# Patient Record
Sex: Female | Born: 1980 | Hispanic: Yes | Marital: Married | Smoking: Never smoker
Health system: Southern US, Community
[De-identification: ages and names within clinical notes are randomized; demographics above are authoritative.]

## PROBLEM LIST (undated history)

## (undated) DIAGNOSIS — E119 Type 2 diabetes mellitus without complications: Secondary | ICD-10-CM

## (undated) DIAGNOSIS — N939 Abnormal uterine and vaginal bleeding, unspecified: Secondary | ICD-10-CM

## (undated) DIAGNOSIS — K759 Inflammatory liver disease, unspecified: Secondary | ICD-10-CM

## (undated) DIAGNOSIS — E785 Hyperlipidemia, unspecified: Secondary | ICD-10-CM

## (undated) DIAGNOSIS — D649 Anemia, unspecified: Secondary | ICD-10-CM

## (undated) HISTORY — DX: Abnormal uterine and vaginal bleeding, unspecified: N93.9

## (undated) HISTORY — PX: CHOLECYSTECTOMY: SHX55

---

## 2012-09-21 ENCOUNTER — Emergency Department: Payer: Self-pay | Admitting: Emergency Medicine

## 2012-09-21 LAB — URINALYSIS, COMPLETE
Bilirubin,UR: NEGATIVE
Blood: NEGATIVE
Glucose,UR: >=500 mg/dL (ref 0–75)
Leukocyte Esterase: NEGATIVE
Ph: 5 (ref 4.5–8.0)
Protein: NEGATIVE
Specific Gravity: 1.036 (ref 1.003–1.030)
Squamous Epithelial: 1
WBC UR: 1 /HPF (ref 0–5)

## 2012-09-21 LAB — CBC
HCT: 42.4 % (ref 35.0–47.0)
HGB: 14.5 g/dL (ref 12.0–16.0)
MCH: 27.5 pg (ref 26.0–34.0)
MCHC: 34.2 g/dL (ref 32.0–36.0)
MCV: 80 fL (ref 80–100)
Platelet: 366 10*3/uL (ref 150–440)
RBC: 5.28 10*6/uL — ABNORMAL HIGH (ref 3.80–5.20)
RDW: 13.1 % (ref 11.5–14.5)

## 2012-09-21 LAB — COMPREHENSIVE METABOLIC PANEL
Alkaline Phosphatase: 119 U/L (ref 50–136)
Anion Gap: 9 (ref 7–16)
Calcium, Total: 9.5 mg/dL (ref 8.5–10.1)
Chloride: 98 mmol/L (ref 98–107)
EGFR (African American): >60
Osmolality: 274 (ref 275–301)
Potassium: 4.2 mmol/L (ref 3.5–5.1)
SGOT(AST): 23 U/L (ref 15–37)
Sodium: 130 mmol/L — ABNORMAL LOW (ref 136–145)
Total Protein: 8.8 g/dL — ABNORMAL HIGH (ref 6.4–8.2)

## 2012-09-23 ENCOUNTER — Emergency Department: Payer: Self-pay | Admitting: Emergency Medicine

## 2012-09-23 LAB — CBC
HGB: 13.3 g/dL (ref 12.0–16.0)
Platelet: 334 10*3/uL (ref 150–440)
RBC: 4.79 10*6/uL (ref 3.80–5.20)
WBC: 7.3 10*3/uL (ref 3.6–11.0)

## 2012-09-24 LAB — COMPREHENSIVE METABOLIC PANEL
Albumin: 3.8 g/dL (ref 3.4–5.0)
Alkaline Phosphatase: 120 U/L (ref 50–136)
BUN: 20 mg/dL — ABNORMAL HIGH (ref 7–18)
Calcium, Total: 9.4 mg/dL (ref 8.5–10.1)
Chloride: 99 mmol/L (ref 98–107)
Creatinine: 0.7 mg/dL (ref 0.60–1.30)
EGFR (Non-African Amer.): >60
Glucose: 425 mg/dL — ABNORMAL HIGH (ref 65–99)
Osmolality: 282 (ref 275–301)
Potassium: 4.1 mmol/L (ref 3.5–5.1)
SGOT(AST): 20 U/L (ref 15–37)
SGPT (ALT): 29 U/L (ref 12–78)
Sodium: 130 mmol/L — ABNORMAL LOW (ref 136–145)
Total Protein: 7.8 g/dL (ref 6.4–8.2)

## 2012-09-24 LAB — URINALYSIS, COMPLETE
Bacteria: NONE SEEN
Blood: NEGATIVE
Glucose,UR: >=500 mg/dL (ref 0–75)
Leukocyte Esterase: NEGATIVE
Nitrite: NEGATIVE
Ph: 5 (ref 4.5–8.0)
Protein: NEGATIVE
RBC,UR: 1 /HPF (ref 0–5)
Squamous Epithelial: <1

## 2012-09-26 ENCOUNTER — Emergency Department: Payer: Self-pay | Admitting: Emergency Medicine

## 2012-09-26 LAB — COMPREHENSIVE METABOLIC PANEL
Bilirubin,Total: 0.4 mg/dL (ref 0.2–1.0)
Calcium, Total: 9.5 mg/dL (ref 8.5–10.1)
Chloride: 98 mmol/L (ref 98–107)
Co2: 22 mmol/L (ref 21–32)
EGFR (African American): >60
Glucose: 289 mg/dL — ABNORMAL HIGH (ref 65–99)
SGOT(AST): 25 U/L (ref 15–37)
Sodium: 132 mmol/L — ABNORMAL LOW (ref 136–145)
Total Protein: 7.7 g/dL (ref 6.4–8.2)

## 2012-09-26 LAB — URINALYSIS, COMPLETE
Bacteria: NONE SEEN
Bilirubin,UR: NEGATIVE
Glucose,UR: >=500 mg/dL (ref 0–75)
Leukocyte Esterase: NEGATIVE
Nitrite: NEGATIVE
Ph: 5 (ref 4.5–8.0)
Protein: NEGATIVE
RBC,UR: NONE SEEN /HPF (ref 0–5)
Squamous Epithelial: 1
WBC UR: 1 /HPF (ref 0–5)

## 2012-09-26 LAB — CBC
HCT: 38.1 % (ref 35.0–47.0)
HGB: 13.4 g/dL (ref 12.0–16.0)
MCH: 28 pg (ref 26.0–34.0)
Platelet: 344 10*3/uL (ref 150–440)
WBC: 6.3 10*3/uL (ref 3.6–11.0)

## 2012-10-06 ENCOUNTER — Other Ambulatory Visit: Payer: Self-pay | Admitting: Family Medicine

## 2012-10-06 LAB — BASIC METABOLIC PANEL
BUN: 10 mg/dL (ref 7–18)
Chloride: 104 mmol/L (ref 98–107)
Creatinine: 0.82 mg/dL (ref 0.60–1.30)
EGFR (African American): >60
Glucose: 80 mg/dL (ref 65–99)
Potassium: 3.6 mmol/L (ref 3.5–5.1)
Sodium: 140 mmol/L (ref 136–145)

## 2012-10-06 LAB — LIPID PANEL
Cholesterol: 181 mg/dL (ref 0–200)
HDL Cholesterol: 61 mg/dL — ABNORMAL HIGH (ref 40–60)
Ldl Cholesterol, Calc: 97 mg/dL (ref 0–100)
Triglycerides: 117 mg/dL (ref 0–200)

## 2012-10-16 ENCOUNTER — Ambulatory Visit: Payer: Self-pay | Admitting: Family Medicine

## 2012-10-18 ENCOUNTER — Ambulatory Visit: Payer: Self-pay | Admitting: Family Medicine

## 2012-12-06 ENCOUNTER — Ambulatory Visit: Payer: Self-pay | Admitting: Internal Medicine

## 2013-02-27 ENCOUNTER — Inpatient Hospital Stay: Payer: Self-pay | Admitting: Surgery

## 2013-02-27 LAB — CBC
HCT: 38 % (ref 35.0–47.0)
HGB: 12.8 g/dL (ref 12.0–16.0)
MCH: 26.8 pg (ref 26.0–34.0)
MCHC: 33.6 g/dL (ref 32.0–36.0)
MCV: 80 fL (ref 80–100)
Platelet: 362 10*3/uL (ref 150–440)
RBC: 4.75 10*6/uL (ref 3.80–5.20)
RDW: 15 % — ABNORMAL HIGH (ref 11.5–14.5)
WBC: 6.2 10*3/uL (ref 3.6–11.0)

## 2013-02-27 LAB — COMPREHENSIVE METABOLIC PANEL
ALBUMIN: 4.4 g/dL (ref 3.4–5.0)
Alkaline Phosphatase: 89 U/L
Anion Gap: 6 — ABNORMAL LOW (ref 7–16)
BUN: 14 mg/dL (ref 7–18)
Bilirubin,Total: 0.4 mg/dL (ref 0.2–1.0)
Calcium, Total: 9.8 mg/dL (ref 8.5–10.1)
Chloride: 102 mmol/L (ref 98–107)
Co2: 28 mmol/L (ref 21–32)
Creatinine: 0.63 mg/dL (ref 0.60–1.30)
EGFR (African American): >60
Glucose: 213 mg/dL — ABNORMAL HIGH (ref 65–99)
Osmolality: 279 (ref 275–301)
POTASSIUM: 3.1 mmol/L — AB (ref 3.5–5.1)
SGOT(AST): 14 U/L — ABNORMAL LOW (ref 15–37)
SGPT (ALT): 23 U/L (ref 12–78)
Sodium: 136 mmol/L (ref 136–145)
TOTAL PROTEIN: 8.8 g/dL — AB (ref 6.4–8.2)

## 2013-02-27 LAB — URINALYSIS, COMPLETE
Bacteria: NONE SEEN
Bilirubin,UR: NEGATIVE
Blood: NEGATIVE
Glucose,UR: >=500 mg/dL (ref 0–75)
Leukocyte Esterase: NEGATIVE
Nitrite: NEGATIVE
PH: 8 (ref 4.5–8.0)
PROTEIN: NEGATIVE
RBC,UR: 1 /HPF (ref 0–5)
Specific Gravity: 1.017 (ref 1.003–1.030)
Squamous Epithelial: 2
WBC UR: NONE SEEN /HPF (ref 0–5)

## 2013-02-27 LAB — POTASSIUM: POTASSIUM: 4.5 mmol/L (ref 3.5–5.1)

## 2013-02-27 LAB — LIPASE, BLOOD: LIPASE: 199 U/L (ref 73–393)

## 2013-03-01 LAB — PATHOLOGY REPORT

## 2013-03-02 LAB — CBC WITH DIFFERENTIAL/PLATELET
BASOS ABS: 0.1 10*3/uL (ref 0.0–0.1)
BASOS PCT: 1 %
EOS PCT: 3.6 %
Eosinophil #: 0.2 10*3/uL (ref 0.0–0.7)
HCT: 28.7 % — ABNORMAL LOW (ref 35.0–47.0)
HGB: 9.6 g/dL — ABNORMAL LOW (ref 12.0–16.0)
LYMPHS PCT: 54.2 %
Lymphocyte #: 2.8 10*3/uL (ref 1.0–3.6)
MCH: 27 pg (ref 26.0–34.0)
MCHC: 33.6 g/dL (ref 32.0–36.0)
MCV: 80 fL (ref 80–100)
MONOS PCT: 6.5 %
Monocyte #: 0.3 x10 3/mm (ref 0.2–0.9)
Neutrophil #: 1.8 10*3/uL (ref 1.4–6.5)
Neutrophil %: 34.7 %
PLATELETS: 257 10*3/uL (ref 150–440)
RBC: 3.58 10*6/uL — ABNORMAL LOW (ref 3.80–5.20)
RDW: 14.6 % — AB (ref 11.5–14.5)
WBC: 5.1 10*3/uL (ref 3.6–11.0)

## 2013-03-02 LAB — COMPREHENSIVE METABOLIC PANEL
AST: 29 U/L (ref 15–37)
Albumin: 2.5 g/dL — ABNORMAL LOW (ref 3.4–5.0)
Alkaline Phosphatase: 44 U/L — ABNORMAL LOW
Anion Gap: 4 — ABNORMAL LOW (ref 7–16)
BUN: 11 mg/dL (ref 7–18)
Bilirubin,Total: 0.4 mg/dL (ref 0.2–1.0)
CALCIUM: 8.4 mg/dL — AB (ref 8.5–10.1)
CO2: 26 mmol/L (ref 21–32)
Chloride: 108 mmol/L — ABNORMAL HIGH (ref 98–107)
Creatinine: 0.56 mg/dL — ABNORMAL LOW (ref 0.60–1.30)
EGFR (Non-African Amer.): >60
Glucose: 95 mg/dL (ref 65–99)
Osmolality: 275 (ref 275–301)
Potassium: 3.6 mmol/L (ref 3.5–5.1)
SGPT (ALT): 27 U/L (ref 12–78)
SODIUM: 138 mmol/L (ref 136–145)
Total Protein: 5.6 g/dL — ABNORMAL LOW (ref 6.4–8.2)

## 2014-05-11 NOTE — Op Note (Signed)
PATIENT NAME:  Caitlyn Vincent, Caitlyn Vincent MR#:  [id number] DATE OF BIRTH:  03-29-1980  DATE OF PROCEDURE:  02/27/2013  PREOPERATIVE DIAGNOSIS: Acute cholecystitis.   POSTOPERATIVE DIAGNOSIS: Acute cholecystitis.   PROCEDURE: Laparoscopic cholecystectomy.   SURGEON: Phoebe Perch, M.D.   ASSISTANTSalome Arnt, PA-S    INDICATIONS: This is a patient with epigastrium and right upper quadrant pain, workup showing acute cholecystitis. Preoperatively via an interpreter we discussed the rationale for surgery, the options of observation, risks of bleeding, infection, recurrence of symptoms, failure to resolve her symptoms, open procedure, bile duct damage, bile duct leak, retained common bile duct stone, any of which could require further surgery and/or ERCP, stent and papillotomy. This was all reviewed for she and her husband via the interpreter, They understood and agreed to proceed.   FINDINGS: Acute cholecystitis, edematous gallbladder.   DESCRIPTION OF PROCEDURE: The patient was induced to general anesthesia. She was given IV antibiotics, VTE prophylaxis was in place. She was prepped and draped in a sterile fashion. Marcaine was infiltrated in the skin and subcutaneous tissues around the periumbilical area. Incision was made. Veress needle was placed. Pneumoperitoneum was obtained. A 5 mm trocar port was placed. The abdominal cavity was explored and under direct vision a 10 mm epigastric port and 2 lateral 5 mm ports were placed. The liver was noted to have multiple adhesions from the dome of the liver to the diaphragm. Some of these were taken down sharply without the use of energy and then the gallbladder was placed on tension. The peritoneum over the infundibulum was incised bluntly. The cystic duct/gallbladder junction was well identified. The cystic artery was doubly clipped and divided, allowing for good visualization of the cystic duct as it entered the infundibulum. It was doubly clipped and divided and  the gallbladder was taken from the gallbladder fossa with electrocautery and passed out through the epigastric port site. The port site was placed.  The area was irrigated with copious amounts of normal saline. Hemostasis was adequate. There was no sign of bleeding, bile leak or bowel injury. The area was irrigated and aspirated and then the camera was placed in the epigastric site to view back to the periumbilical site. There was no sign of adhesions other than over the liver. Therefore, pneumoperitoneum was released. All ports were removed. Fascial edges at the epigastric site were approximated with 0 Vicryl; 4-0 subcuticular Monocryl was used on all skin edges. Steri-Strips, Mastisol and sterile dressings were placed. The patient tolerated the procedure well. There were no complications. She was taken to the recovery room in stable condition to be admitted for continued care.    ____________________________ Jerrol Banana. Burt Knack, MD rec:cs D: 02/27/2013 15:30:59 ET T: 02/27/2013 19:10:03 ET JOB#: [id number]  cc: Jerrol Banana. Burt Knack, MD, <Dictator> Florene Glen MD ELECTRONICALLY SIGNED 02/28/2013 16:48

## 2014-05-11 NOTE — Consult Note (Signed)
PATIENT NAME:  Caitlyn Vincent, FRID MR#:  [id number] DATE OF BIRTH:  1980/07/12  MEDICAL CONSULTATION   DATE OF CONSULTATION:  02/27/2013  REFERRING PHYSICIAN:  Micheline Maze, MD CONSULTING PHYSICIAN:  Nicholes Mango, MD  REASON FOR MEDICAL CONSULTATION: Medical management of diabetes.   HISTORY OF PRESENT ILLNESS: The patient is a 34 year old Hispanic female with past medical history of insulin-dependent diabetes mellitus, who came into the ER with 8-hour history of epigastric abdominal pain associated with nausea and vomiting. Abdominal ultrasound has revealed cholelithiasis without cholecystitis. The patient is diagnosed with biliary colic. Dr. Pat Patrick is consulted. He is planning to do surgery today, and hospitalist team is consulted for diabetes management. During my examination, the patient is still complaining of epigastric abdominal pain without radiation. She has intractable nausea and vomiting. Denies any chest pain or shortness of breath. No family members at bedside. Denies any other complaints.   PAST MEDICAL HISTORY: Insulin-dependent diabetes mellitus.   PAST SURGICAL HISTORY: None.   ALLERGIES: No known drug allergies.   HOME MEDICATIONS:  1. Lantus dose unknown.  2. Metformin 500 mg 2 tablets p.o. 2 times a day.   PSYCHOSOCIAL HISTORY: Lives at home with family. No history of smoking, alcohol or illicit drug usage   REVIEW OF SYSTEMS:  CONSTITUTIONAL: Denies any fever, fatigue.  EYES: Denies blurry vision or double vision.  ENT: Denies epistaxis or discharge.  RESPIRATION: Denies cough, COPD.  CARDIOVASCULAR: No chest pain, palpitations.  GASTROINTESTINAL: Complaining of intractable nausea and vomiting, episodic epigastric abdominal pain.  ENDOCRINE: Denies polyuria, nocturia, thyroid problems. Has insulin-dependent diabetes mellitus. GYNECOLOGIC AND BREASTS: Denies breast mass or vaginal discharge.  INTEGUMENTARY: No acne, rash, lesions.  MUSCULOSKELETAL: No joint pain in  the back, neck. Denies gout. NEUROLOGIC: No vertigo or ataxia.  PSYCHIATRIC: No ADD or OCD.  PHYSICAL EXAMINATION:  VITAL SIGNS: Temperature 97.8, pulse 63, respirations 18, blood pressure is 103/58, pulse oximetry 100%.  GENERAL APPEARANCE: Not under acute distress. Moderately built and nourished.  HEENT: Normocephalic, atraumatic. Pupils are equally reacting to light and accommodation. No scleral icterus. No conjunctival injection. No sinus tenderness. No postnasal drip. Dry mucous membranes.  NECK: Supple. No JVD. No thyromegaly. Trachea is midline. Range of motion is intact.  LUNGS: Clear to auscultation bilaterally. No accessory muscle usage.  CARDIAC: S1, S2 normal. Regular rate and rhythm. No murmurs.  GASTROINTESTINAL: Soft. Bowel sounds are positive in all 4 quadrants. Positive epigastric tenderness. No rebound tenderness. No masses felt.  NEUROLOGIC: Awake, alert, oriented x3. Motor and sensory grossly intact. Reflexes are 2+.  EXTREMITIES: No edema. No cyanosis. No clubbing.  SKIN: Warm to touch. Normal turgor. No rashes. No lesions.  MUSCULOSKELETAL: No joint effusion, tenderness, erythema.  PSYCHIATRIC: Normal mood and affect.   LABORATORY AND IMAGING STUDIES: Ultrasound of the abdomen has revealed cholelithiasis without cholecystitis, hepatic steatosis. Urine pregnancy test is negative. LFTs: Total protein is elevated at 8.8, AST is low at 14. The rest of the LFTs are normal. CBC normal. Urinalysis: Trace ketones, cloudy in appearance, leukocytes and nitrites are negative. CHEM-8: Glucose is 213. BUN and creatinine are normal. Sodium 136, potassium 3.1, anion gap 6. The rest of the BMP results are normal. Lipase is at 199.   ASSESSMENT AND PLAN: A 34 year old Hispanic female who came into the ER with epigastric abdominal pain, nausea, vomiting and diagnosed with biliary colic, admitted to Dr. Rolin Barry service. Hospitalist team is consulted regarding medical management of diabetes  mellitus.   1. Acute epigastric  pain secondary to biliary colic. No acute cholecystitis. Management per surgery. The patient is n.p.o. and getting IV fluids with potassium.  2. Intractable nausea and vomiting, probably from biliary colic and side effects of narcotic medications. The patient is n.p.o. Will give her Zofran 4 mg IV q.6 hours p.r.n. Continue IV fluids with potassium supplements.  3. Hypokalemia. Supplement potassium via IV fluids.  4. Insulin-dependent diabetes mellitus. The patient is currently n.p.o. She will be on sliding scale insulin while she is n.p.o. Postoperatively, will follow and manage diabetes.  5. Will provide her gastrointestinal prophylaxis with Protonix IV.  6. Deep venous thrombosis prophylaxis with Lovenox.   CODE STATUS: She is full code.  Thank you, Dr. Pat Patrick, for consulting hospitalist team regarding medical management.   TIME SPENT: 45 minutes.   ____________________________ Nicholes Mango, MD ag:lb D: 02/27/2013 07:43:49 ET T: 02/27/2013 08:06:09 ET JOB#: [id number]  cc: Nicholes Mango, MD, <Dictator> Nicholes Mango MD ELECTRONICALLY SIGNED 03/15/2013 18:36

## 2014-05-11 NOTE — H&P (Signed)
PATIENT NAME:  Caitlyn Vincent, Caitlyn Vincent MR#:  [id number] DATE OF BIRTH:  08-May-1980  DATE OF ADMISSION:  02/27/2013  PRIMARY CARE PHYSICIAN: Dr. Ellene Route.   ADMITTING PHYSICIAN: Dr. Pat Patrick.   CHIEF COMPLAINT: Abdominal pain, nausea and vomiting.   BRIEF HISTORY: Caitlyn Vincent is a 34 year old woman seen in the Emergency Room for  an approximate 8 hour history of substernal abdominal pain, midepigastric abdominal pain associated with 2 episodes of nausea and vomiting. She  had 3 previous episodes, the most recent one in September 2014. She is evaluated in the Emergency Room and felt to have evidence for biliary tract disease without acute cholecystitis and was referred to our office. Apparently, there was miscommunication. The patient did not follow up with [place]. It is unclear the reason behind her failure to follow up. She has no other significant GI history. She has history of hepatitis, yellow jaundice, pancreatitis, peptic ulcer disease or diverticulitis. Ultrasound work-up today revealed stones without evidence of acute cholecystitis. Liver function studies were unremarkable. She does not have an elevated white blood cell count. However, she continues to have significant substernal pain and will be admitted for that reason.   Her medical history is significant for diabetes, occasionally insulin-dependent. She is followed by a primary care group in the community and is on p.o. medications but occasionally takes sliding scale insulin. She denies any previous surgical history. She does not have a history of cardiac disease, hypertension or thyroid disease.   CURRENT MEDICATIONS: Include metformin 1000 mg b.i.d. with meals.   ALLERGIES:  She has no medical allergies.   SOCIAL HISTORY: She is not a cigarette smoker. She does not drink alcohol. She takes no illicit drugs.    REVIEW OF SYSTEMS: Otherwise unremarkable.   PHYSICAL EXAMINATION: GENERAL: She is an alert woman in moderate distress from abdominal  discomfort. Her pain was initially controlled with Dilaudid but has continued.  VITAL SIGNS: Blood pressure is 117/60, heart rate 82 and regular.  HEENT: Unremarkable. No scleral icterus. There is no pupillary abnormalities.  NECK: Supple, nontender with a midline trachea. No adenopathy.  CHEST: Clear. No adventitious sounds and normal pulmonary excursion.  CARDIAC: No murmurs or gallops. She seems to be in normal sinus rhythm. She does appear to have colicky pain on presentation. Her pain is substernal, midepigastric without radiation. She has no significant abdominal findings other than some mild substernal tenderness. She has minimal guarding. No rebound. She has active bowel sounds.  EXTREMITIES: Lower extremity exam reveals full range of motion, no deformities. Good distal pulses.  PSYCHIATRIC: Normal orientation, normal affect. She is accompanied by her husband.   ASSESSMENT AND PLAN: I discussed the situation with the husband and the patient through a hospital interpreter. They are quite upset at the lack of follow-up or intervention in the past. She does appear to have symptomatic biliary tract disease at the present time. We will admit her for pain control and plan for surgical intervention as can be arranged in the operating room. The patient and family are in agreement.   ____________________________ Micheline Maze, MD rle:dp D: 02/27/2013 06:51:55 ET T: 02/27/2013 07:14:26 ET JOB#: [id number]  cc: Rodena Goldmann III, MD, <Dictator> Raye Sorrow, MD Rodena Goldmann MD ELECTRONICALLY SIGNED 02/28/2013 1:09

## 2014-05-11 NOTE — Discharge Summary (Signed)
PATIENT NAME:  Caitlyn Vincent, Caitlyn Vincent MR#:  [id number] DATE OF BIRTH:  11-23-1980  DATE OF ADMISSION:  02/27/2013 DATE OF DISCHARGE:  03/02/2013   DIAGNOSES:  1. Acute cholecystitis.  2. Cholelithiasis.  3. Diabetes.   PROCEDURE: Laparoscopic cholecystectomy.   HISTORY OF PRESENT ILLNESS AND HOSPITAL COURSE: This is a patient with unrelenting right upper quadrant pain. She has had multiple episodes of biliary colic in the past, but this time the pain has not gone away, and a workup has shown gallstones with likely acute cholecystitis. She was taken to the operating room, where laparoscopic cholecystectomy was performed. Postoperatively, she did well. She was continued on IV antibiotics for a short period of time and is currently tolerating a regular diet and is discharged in stable condition with oral analgesics, to restart all of her home medications and instructions to shower. She will follow up in our office in 10 days.    ____________________________ Jerrol Banana Burt Knack, MD rec:lb D: 03/02/2013 12:59:23 ET T: 03/02/2013 13:27:25 ET JOB#: [id number]  cc: Jerrol Banana. Burt Knack, MD, <Dictator> Florene Glen MD ELECTRONICALLY SIGNED 03/02/2013 16:23

## 2014-08-27 ENCOUNTER — Emergency Department
Admission: EM | Admit: 2014-08-27 | Discharge: 2014-08-27 | Disposition: A | Discharge status: Home / Self Care | Payer: Self-pay | Attending: Emergency Medicine | Admitting: Emergency Medicine

## 2014-08-27 ENCOUNTER — Encounter: Payer: Self-pay | Admitting: Emergency Medicine

## 2014-08-27 DIAGNOSIS — Z794 Long term (current) use of insulin: Secondary | ICD-10-CM | POA: Insufficient documentation

## 2014-08-27 DIAGNOSIS — Z3202 Encounter for pregnancy test, result negative: Secondary | ICD-10-CM | POA: Insufficient documentation

## 2014-08-27 DIAGNOSIS — E1165 Type 2 diabetes mellitus with hyperglycemia: Secondary | ICD-10-CM | POA: Insufficient documentation

## 2014-08-27 DIAGNOSIS — R739 Hyperglycemia, unspecified: Secondary | ICD-10-CM

## 2014-08-27 HISTORY — DX: Type 2 diabetes mellitus without complications: E11.9

## 2014-08-27 LAB — BLOOD GAS, ARTERIAL
ALLENS TEST (PASS/FAIL): POSITIVE — AB
Acid-base deficit: 1.5 mmol/L (ref 0.0–2.0)
BICARBONATE: 21.3 meq/L (ref 21.0–28.0)
FIO2: 0.21
O2 Saturation: 98.5 %
PCO2 ART: 30 mmHg — AB (ref 32.0–48.0)
Patient temperature: 37
pH, Arterial: 7.46 — ABNORMAL HIGH (ref 7.350–7.450)
pO2, Arterial: 110 mmHg — ABNORMAL HIGH (ref 83.0–108.0)

## 2014-08-27 LAB — CBC
HEMATOCRIT: 31.1 % — AB (ref 35.0–47.0)
Hemoglobin: 9.8 g/dL — ABNORMAL LOW (ref 12.0–16.0)
MCH: 22.3 pg — ABNORMAL LOW (ref 26.0–34.0)
MCHC: 31.5 g/dL — AB (ref 32.0–36.0)
MCV: 70.9 fL — ABNORMAL LOW (ref 80.0–100.0)
Platelets: 366 10*3/uL (ref 150–440)
RBC: 4.39 MIL/uL (ref 3.80–5.20)
RDW: 16.5 % — ABNORMAL HIGH (ref 11.5–14.5)
WBC: 5.4 10*3/uL (ref 3.6–11.0)

## 2014-08-27 LAB — COMPREHENSIVE METABOLIC PANEL
ALBUMIN: 3.9 g/dL (ref 3.5–5.0)
ALT: 16 U/L (ref 14–54)
AST: 21 U/L (ref 15–41)
Alkaline Phosphatase: 59 U/L (ref 38–126)
Anion gap: 8 (ref 5–15)
BILIRUBIN TOTAL: 0.2 mg/dL — AB (ref 0.3–1.2)
BUN: 17 mg/dL (ref 6–20)
CO2: 25 mmol/L (ref 22–32)
CREATININE: 0.7 mg/dL (ref 0.44–1.00)
Calcium: 9.1 mg/dL (ref 8.9–10.3)
Chloride: 96 mmol/L — ABNORMAL LOW (ref 101–111)
GFR calc non Af Amer: >60 mL/min (ref >60)
GLUCOSE: 541 mg/dL — AB (ref 65–99)
Potassium: 4 mmol/L (ref 3.5–5.1)
SODIUM: 129 mmol/L — AB (ref 135–145)
Total Protein: 7.2 g/dL (ref 6.5–8.1)

## 2014-08-27 LAB — GLUCOSE, CAPILLARY
GLUCOSE-CAPILLARY: 206 mg/dL — AB (ref 65–99)
Glucose-Capillary: >600 mg/dL (ref 65–99)
Glucose-Capillary: 190 mg/dL — ABNORMAL HIGH (ref 65–99)

## 2014-08-27 LAB — URINALYSIS COMPLETE WITH MICROSCOPIC (ARMC ONLY)
Bilirubin Urine: NEGATIVE
Glucose, UA: >500 mg/dL — AB
Hgb urine dipstick: NEGATIVE
KETONES UR: NEGATIVE mg/dL
Leukocytes, UA: NEGATIVE
Nitrite: NEGATIVE
PH: 5 (ref 5.0–8.0)
PROTEIN: NEGATIVE mg/dL
Specific Gravity, Urine: 1.024 (ref 1.005–1.030)

## 2014-08-27 LAB — POCT PREGNANCY, URINE: Preg Test, Ur: NEGATIVE

## 2014-08-27 MED ORDER — INSULIN REGULAR HUMAN 100 UNIT/ML IJ SOLN: 20.0000 [IU] | Freq: Three times a day (TID) | INTRAMUSCULAR | Status: DC

## 2014-08-27 MED ORDER — SODIUM CHLORIDE 0.9 % IV BOLUS (SEPSIS)
2000.0000 mL | Freq: Once | INTRAVENOUS | Status: AC
  Administered 2014-08-27: 2000 mL via INTRAVENOUS

## 2014-08-27 MED ORDER — SODIUM CHLORIDE 0.9 % IV SOLN
INTRAVENOUS | Status: DC
  Filled 2014-08-27 (×2): qty 2.5

## 2014-08-27 MED ORDER — INSULIN ASPART 100 UNIT/ML ~~LOC~~ SOLN
SUBCUTANEOUS | Status: AC
  Administered 2014-08-27: 8 [IU]
  Filled 2014-08-27: qty 8

## 2014-08-27 NOTE — ED Notes (Signed)
CBG result 190; MD made aware.

## 2014-08-27 NOTE — ED Notes (Signed)
Pt given water to drink for urine spec.

## 2014-08-27 NOTE — ED Provider Notes (Signed)
[facility] Emergency Department Provider Note  ____________________________________________  Time seen: 5:15 AM  I have reviewed the triage vital signs and the nursing notes.   HISTORY  Chief Complaint Hyperglycemia; Dizziness; Weakness; and Urinary Frequency      HPI Caitlyn Vincent is a 34 y.o. female presents with hyperglycemia, polyuria, polydipsia. Patient stated glucose was noted to be high at home glucose read critical high on arrival to the emergency department. Patient denies any recent illness no cough no fever no abdominal pain vomiting or diarrhea. Patient states that "she's been under a lot of stress"     Past Medical History  Diagnosis Date  . Diabetes mellitus without complication     There are no active problems to display for this patient.   Past Surgical History  Procedure Laterality Date  . Cholecystectomy      Current Outpatient Rx  Name  Route  Sig  Dispense  Refill  . insulin regular (NOVOLIN R,HUMULIN R) 100 units/mL injection   Subcutaneous   Inject 20 Units into the skin 3 (three) times daily before meals.           Allergies Review of patient's allergies indicates no known allergies.  No family history on file.  Social History History  Substance Use Topics  . Smoking status: Never Smoker   . Smokeless tobacco: Not on file  . Alcohol Use: Yes    Review of Systems  Constitutional: Negative for fever. Eyes: Negative for visual changes. ENT: Negative for sore throat. Cardiovascular: Negative for chest pain. Respiratory: Negative for shortness of breath. Gastrointestinal: Negative for abdominal pain, vomiting and diarrhea. Genitourinary: Negative for dysuria. Polyuria polydipsia Musculoskeletal: Negative for back pain. Skin: Negative for rash. Neurological: Negative for headaches, focal weakness or numbness.   10-point ROS otherwise  negative.  ____________________________________________   PHYSICAL EXAM:  VITAL SIGNS: ED Triage Vitals  Enc Vitals Group     BP 08/27/14 0507 109/56 mmHg     Pulse Rate 08/27/14 0507 63     Resp 08/27/14 0507 18     Temp 08/27/14 0507 98.1 F (36.7 C)     Temp Source 08/27/14 0507 Oral     SpO2 08/27/14 0507 100 %     Weight 08/27/14 0507 125 lb (56.7 kg)     Height 08/27/14 0507 5' (1.524 m)     Head Cir --      Peak Flow --      Pain Score 08/27/14 0511 0     Pain Loc --      Pain Edu? --      Excl. in [place]? --      Constitutional: Alert and oriented. Well appearing and in no distress. Eyes: Conjunctivae are normal. PERRL. Normal extraocular movements. ENT   Head: Normocephalic and atraumatic.   Nose: No congestion/rhinnorhea.   Mouth/Throat: Mucous membranes are moist.   Neck: No stridor. Cardiovascular: Normal rate, regular rhythm. Normal and symmetric distal pulses are present in all extremities. No murmurs, rubs, or gallops. Respiratory: Normal respiratory effort without tachypnea nor retractions. Breath sounds are clear and equal bilaterally. No wheezes/rales/rhonchi. Gastrointestinal: Soft and nontender. No distention. There is no CVA tenderness. Genitourinary: deferred Musculoskeletal: Nontender with normal range of motion in all extremities. No joint effusions.  No lower extremity tenderness nor edema. Neurologic:  Normal speech and language. No gross focal neurologic deficits are appreciated. Speech is normal.  Skin:  Skin is warm, dry and intact. No rash noted. Psychiatric: Mood  and affect are normal. Speech and behavior are normal. Patient exhibits appropriate insight and judgment.  ____________________________________________    LABS (pertinent positives/negatives)  Labs Reviewed  GLUCOSE, CAPILLARY - Abnormal; Notable for the following:    Glucose-Capillary >600 (*)    All other components within normal limits  CBC - Abnormal; Notable for  the following:    Hemoglobin 9.8 (*)    HCT 31.1 (*)    MCV 70.9 (*)    MCH 22.3 (*)    MCHC 31.5 (*)    RDW 16.5 (*)    All other components within normal limits  COMPREHENSIVE METABOLIC PANEL - Abnormal; Notable for the following:    Sodium 129 (*)    Chloride 96 (*)    Glucose, Bld 541 (*)    Total Bilirubin 0.2 (*)    All other components within normal limits  BLOOD GAS, VENOUS - Abnormal; Notable for the following:    pH, Ven 7.08 (*)    pCO2, Ven 68 (*)    Bicarbonate 20.2 (*)    Acid-base deficit 10.8 (*)    All other components within normal limits  BLOOD GAS, ARTERIAL - Abnormal; Notable for the following:    pH, Arterial 7.46 (*)    pCO2 arterial 30 (*)    pO2, Arterial 110 (*)    Allens test (pass/fail) POSITIVE (*)    All other components within normal limits  URINALYSIS COMPLETEWITH MICROSCOPIC ([facility] ONLY)        INITIAL IMPRESSION / ASSESSMENT AND PLAN / ED COURSE  Pertinent labs & imaging results that were available during my care of the patient were reviewed by me and considered in my medical decision making (see chart for details).  History of physical exam consistent with acute hyperglycemia with glucose 541 anion gap normal. Patient's ABG revealed a normal pH of 7.46. Patient received 2 L normal saline and 8 units of IV insulin with resultant glucose 191  ____________________________________________   FINAL CLINICAL IMPRESSION(S) / ED DIAGNOSES  Final diagnoses:  Acute hyperglycemia      Gregor Hams, MD 08/29/14 [phone]

## 2014-08-27 NOTE — ED Notes (Signed)
Pt dropped off to ED with c/o hyperglycemia. Sugar >500 at home prior to arrival.

## 2014-08-27 NOTE — Discharge Instructions (Signed)
Hiperglucemia °(Hyperglycemia) °La hiperglucemia ocurre cuando la glucosa (azúcar) en su sangre está demasiado elevada. Puede suceder por varias razones, pero a menudo ocurre en personas que no saben que tienen diabetes o no la controlan adecuadamente.  °CAUSAS °Tanto si tiene diabetes como si no, existen otras causas para la hiperglucemia. La hiperglucemia puede producirse cuando tiene diabetes, pero también puede presentarse en otras situaciones de las que podría no ser consciente, como por ejemplo: °Diabetes °· Si tiene diabetes y tiene problemas para controlar su glucosa en sangre, la hiperglucemia podría producirse debido a las siguientes razones: °¨ No seguir el plan de alimentación. °¨ No tomar los medicamentos para la diabetes o tomarlos de forma inadecuada. °¨ Realizar menos ejercicio del que normalmente hace. °¨ Estar enfermo. °Prediabetes °· Esto no puede ignorarse. Antes de que la persona presente diabetes de tipo 2, casi siempre hay "prediabetes". Esto ocurre cuando su glucosa en sangre es mayor que lo normal, pero no lo suficiente como para diagnosticar diabetes. La investigación ha demostrado que algunos daños al cuerpo de largo plazo, en especial los del corazón y el sistema circulatorio, podrían haber ocurrido durante el periodo de prediabetes. Si controla la glucosa en sangre cuando tiene prediabetes, podrá retardar o evitar que se desarrrolle la diabetes tipo 2. °El estrés °· Si tiene diabetes, deberá hacer una dieta, tomar medicamentos orales o insulina para controlarla. Sin embargo, encontrará que la glucosa en sangre es mayor que lo normal en el hospital tenga o no diabetes. Científicamente se lo denomina "hiperglucemia por estrés". El estrés puede elevar su glucosa en sangre. Esto ocurre porque el organismo genera hormonas en los momentos de estrés. Si el estrés ha sido la causa del alto nivel de glucosa en sangre, el médico podrá realizar un seguimiento de manera regular. De esta manera,  podrá asegurarse de que la hiperglucemia no empeora o progresa hacia diabetes. °Esteroides °· Los esteroides son medicamentos que actúan en la infección que ataca al sistema inmunológico para bloquear la inflamación o la infección. Un efecto secundario puede ser el aumento de glucosa en sangre. Muchas personas pueden producir la suficiente insulina extra para este aumento, pero aquellos que no pueden, los esteroides pueden hacer que los niveles sean aún mayores. No es inusual que los tratamientos con esteorides "destapen" una diabetes que se está desarrollando. No siempre es posible determinar si la hiperglucemia desaparecerá una vez que se detenga el consumo de esteroides. A veces se realiza un análisis de sangre especial denominado A1c para determinar si la glucosa en sangre se ha elevado antes de comenzar con el consumo de esteroides. °SÍNTOMAS °· Sed. °· Necesidad frecuente de orinar. °· Boca seca. °· Visión borrosa. °· Cansancio o fatiga. °· Debilidad. °· Somnolencia. °· Hormigueo en el pie o pierna. °DIAGNÓSTICO °El diagnóstico se realiza mediante el control de la glucosa en sangre de una o varias de las siguientes maneras: °· Análisis A1c. Es una sustancia química que se encuentra en la sangre. °· Control de glucosa en sangre con tiras de prueba. °· Resultados de laboratorio. °TRATAMIENTO °Primero, es importante conocer la causa de la hiperglucemia antes de tratarla. El tratamiento puede ser el siguiente, aunque pueden ser otros: °· Educación °· Cambios o ajustes en los medicamentos. °· Cambios o ajustes en el plan de alimentación. °· Tratamiento por enfermedades, infecciones, etc. °· Control de glucosa en sangre más frecuente. °· Cambios en el plan de ejercicios. °· Disminución o interrupción del consumo de esteroides. °· Cambios en el estilo de vida. °INSTRUCCIONES PARA   EL CUIDADO DOMICILIARIO °· Contrólese la glucosa en sangre, como se lo indicaron. °· Haga ejercicios regularmente. El profesional que lo  asiste le dará instrucciones relacionadas con el ejercicio físico. La prediabetes que es consecuencia de situaciones de estrés, puede mejorar con la actividad física. °· Consuma alimentos saludables y balanceados. Coma a menudo y de manera regular, en momentos fijos. El profesional o el nutricionista le dará una dieta especial para controlar su ingestión de azúcar. °· Mantener su peso ideal es importante. Si lo necesita, perder un poco de peso, como 5 ó 7 Kg. puede ser beneficioso para mejorar los niveles de azúcar en sangre. °SOLICITE ATENCIÓN MÉDICA SI: °· Tiene preguntas relacionadas con los medicamentos, la actividad o la dieta. °· Continúa teniendo síntomas (como mucha sed, deseos intensos de orinar o aumento de peso) °SOLICITE ATENCIÓN MÉDICA DE INMEDIATO SI: °· Vomita o tiene diarrea. °· Su respiración huele frutal. °· La frecuencia respiratoria es más rápida o más lenta. °· Está somnoliento o incoherente. °· Siente adormecimiento, hormigueos o dolor en los pies o en las manos. °· Siente dolor en el pecho. °· Sus síntomas empeoran aunque haya seguido las indicaciones de su médico. °· Tiene otras preguntas o preocupaciones. °Document Released: 01/04/2005 Document Revised: 03/29/2011 °[employer]® Patient Information ©2015 [employer]. This information is not intended to replace advice given to you by your health care provider. Make sure you discuss any questions you have with your health care provider. ° °

## 2014-08-27 NOTE — ED Notes (Signed)
RT to bedside to obtained MD ordered ABG.

## 2014-08-27 NOTE — ED Notes (Addendum)
Lab called with critical glucose of 541. Anion gap 8. Results are not consistent with VBG results; pH 7.08 on VBG. MD made aware. MD with VORB to repeat gas, however wants arterial gas. Order entered by this RN; RT made aware.

## 2014-08-27 NOTE — ED Notes (Signed)
Per ed Tech, pt unable to void at this time.

## 2014-08-27 NOTE — ED Notes (Signed)
ABG results returned; MD made aware. VORB to discontinue insulin gtt; MD aware that it has not been started as we were waiting on labs and repeat gas to confirm value discrepancies. RN confirmed with MD that he does NOT want gtt to be started. CBG to be rechecked at this time.

## 2014-08-30 ENCOUNTER — Encounter: Payer: Self-pay | Admitting: *Deleted

## 2014-08-30 ENCOUNTER — Emergency Department
Admission: EM | Admit: 2014-08-30 | Discharge: 2014-08-30 | Disposition: A | Discharge status: Home / Self Care | Payer: No Typology Code available for payment source | Attending: Emergency Medicine | Admitting: Emergency Medicine

## 2014-08-30 DIAGNOSIS — M791 Myalgia, unspecified site: Secondary | ICD-10-CM

## 2014-08-30 DIAGNOSIS — S3992XA Unspecified injury of lower back, initial encounter: Secondary | ICD-10-CM | POA: Diagnosis present

## 2014-08-30 DIAGNOSIS — Y9289 Other specified places as the place of occurrence of the external cause: Secondary | ICD-10-CM | POA: Diagnosis not present

## 2014-08-30 DIAGNOSIS — E109 Type 1 diabetes mellitus without complications: Secondary | ICD-10-CM | POA: Diagnosis not present

## 2014-08-30 DIAGNOSIS — Y9389 Activity, other specified: Secondary | ICD-10-CM | POA: Diagnosis not present

## 2014-08-30 DIAGNOSIS — Y998 Other external cause status: Secondary | ICD-10-CM | POA: Insufficient documentation

## 2014-08-30 DIAGNOSIS — Z794 Long term (current) use of insulin: Secondary | ICD-10-CM | POA: Insufficient documentation

## 2014-08-30 DIAGNOSIS — R202 Paresthesia of skin: Secondary | ICD-10-CM | POA: Insufficient documentation

## 2014-08-30 LAB — BLOOD GAS, VENOUS
Acid-base deficit: 10.8 mmol/L — ABNORMAL HIGH (ref 0.0–2.0)
BICARBONATE: 20.2 meq/L — AB (ref 21.0–28.0)
PH VEN: 7.08 — AB (ref 7.320–7.430)
Patient temperature: 37
pCO2, Ven: 68 mmHg — ABNORMAL HIGH (ref 44.0–60.0)

## 2014-08-30 LAB — GLUCOSE, CAPILLARY: Glucose-Capillary: 347 mg/dL — ABNORMAL HIGH (ref 65–99)

## 2014-08-30 MED ORDER — INSULIN ASPART 100 UNIT/ML ~~LOC~~ SOLN
20.0000 [IU] | Freq: Once | SUBCUTANEOUS | Status: AC
  Administered 2014-08-30: 20 [IU] via SUBCUTANEOUS
  Filled 2014-08-30: qty 20

## 2014-08-30 MED ORDER — INSULIN REGULAR HUMAN 100 UNIT/ML IJ SOLN: 20.0000 [IU] | Freq: Once | INTRAMUSCULAR | Status: DC

## 2014-08-30 MED ORDER — CYCLOBENZAPRINE HCL 5 MG PO TABS: 5.0000 mg | ORAL_TABLET | Freq: Three times a day (TID) | ORAL | Status: DC | PRN

## 2014-08-30 MED ORDER — KETOROLAC TROMETHAMINE 30 MG/ML IJ SOLN
30.0000 mg | Freq: Once | INTRAMUSCULAR | Status: AC
  Administered 2014-08-30: 30 mg via INTRAVENOUS
  Filled 2014-08-30: qty 1

## 2014-08-30 MED ORDER — KETOROLAC TROMETHAMINE 10 MG PO TABS: 10.0000 mg | ORAL_TABLET | Freq: Three times a day (TID) | ORAL | Status: DC

## 2014-08-30 MED ORDER — DIAZEPAM 5 MG/ML IJ SOLN
5.0000 mg | Freq: Once | INTRAMUSCULAR | Status: AC
  Administered 2014-08-30: 5 mg via INTRAVENOUS
  Filled 2014-08-30: qty 2
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
## 2014-08-30 NOTE — ED Notes (Signed)
Waiting on interpretor to arrival to room in order to administer meds to the pt.

## 2014-08-30 NOTE — ED Notes (Signed)
PA Berniece Salines at bedside.

## 2014-08-30 NOTE — ED Notes (Signed)
Pt placed on med hold will be d/c shortly. Pt made aware and verbalized understanding at this time.

## 2015-02-08 DIAGNOSIS — Z791 Long term (current) use of non-steroidal anti-inflammatories (NSAID): Secondary | ICD-10-CM | POA: Insufficient documentation

## 2015-02-08 DIAGNOSIS — Z794 Long term (current) use of insulin: Secondary | ICD-10-CM | POA: Insufficient documentation

## 2015-02-08 DIAGNOSIS — L03317 Cellulitis of buttock: Secondary | ICD-10-CM | POA: Insufficient documentation

## 2015-02-08 DIAGNOSIS — E119 Type 2 diabetes mellitus without complications: Secondary | ICD-10-CM | POA: Insufficient documentation

## 2015-02-08 DIAGNOSIS — M545 Low back pain: Secondary | ICD-10-CM | POA: Insufficient documentation

## 2015-02-08 NOTE — ED Notes (Addendum)
Information obtained via [facility] interpreter.Patient reports gave self insulin injection in top of buttocks.  Noticed areas to be red and painful.

## 2015-02-09 ENCOUNTER — Emergency Department
Admission: EM | Admit: 2015-02-09 | Discharge: 2015-02-09 | Disposition: A | Discharge status: Home / Self Care | Payer: Self-pay | Attending: Emergency Medicine | Admitting: Emergency Medicine

## 2015-02-09 ENCOUNTER — Encounter: Payer: Self-pay | Admitting: Emergency Medicine

## 2015-02-09 DIAGNOSIS — M545 Low back pain, unspecified: Secondary | ICD-10-CM

## 2015-02-09 DIAGNOSIS — L03317 Cellulitis of buttock: Secondary | ICD-10-CM

## 2015-02-09 MED ORDER — IBUPROFEN 600 MG PO TABS: 600.0000 mg | ORAL_TABLET | Freq: Three times a day (TID) | ORAL | Status: DC | PRN

## 2015-02-09 MED ORDER — CEPHALEXIN 500 MG PO CAPS
500.0000 mg | ORAL_CAPSULE | Freq: Four times a day (QID) | ORAL | Status: DC
Start: 2015-02-09 — End: 2015-02-10

## 2015-02-09 NOTE — ED Provider Notes (Signed)
[facility] Emergency Department Provider Note    ____________________________________________  Time seen: [id number]  I have reviewed the triage vital signs and the nursing notes.   HISTORY  Chief Complaint Back Pain   History limited by: [facility] Interpreter utilized   HPI Caitlyn Vincent is a 35 y.o. female who comes to the emergency department today because of concerns for pain. It is located in the bilateral upper but talks. It started today. It has gradually gotten worse. She states it is now severe. She states that the pain is located over the sites of where she gives herself insulin injections. She states she gave herself insulin injections I both of these sites today for the pain started. She denies cleaning the area of skin prior to the injections. She states it was a new vial of medicine however the same medicine she normally uses. She denies any fevers. Denies any nausea or vomiting.     Past Medical History  Diagnosis Date  . Diabetes mellitus without complication ([place])     There are no active problems to display for this patient.   Past Surgical History  Procedure Laterality Date  . Cholecystectomy      Current Outpatient Rx  Name  Route  Sig  Dispense  Refill  . insulin regular (NOVOLIN R,HUMULIN R) 100 units/mL injection   Subcutaneous   Inject 0.2 mLs (20 Units total) into the skin 3 (three) times daily before meals. Patient taking differently: Inject 25 Units into the skin 3 (three) times daily before meals.    10 mL   0   . cyclobenzaprine (FLEXERIL) 5 MG tablet   Oral   Take 1 tablet (5 mg total) by mouth every 8 (eight) hours as needed for muscle spasms.   12 tablet   0   . ketorolac (TORADOL) 10 MG tablet   Oral   Take 1 tablet (10 mg total) by mouth every 8 (eight) hours.   15 tablet   0     Allergies Review of patient's allergies indicates no known allergies.  History reviewed. No pertinent  family history.  Social History Social History  Substance Use Topics  . Smoking status: Never Smoker   . Smokeless tobacco: None  . Alcohol Use: Yes    Review of Systems  Constitutional: Negative for fever. Cardiovascular: Negative for chest pain. Respiratory: Negative for shortness of breath. Gastrointestinal: Negative for abdominal pain, vomiting and diarrhea. Neurological: Negative for headaches, focal weakness or numbness.   10-point ROS otherwise negative.  ____________________________________________   PHYSICAL EXAM:  VITAL SIGNS: ED Triage Vitals  Enc Vitals Group     BP 02/08/15 2333 102/47 mmHg     Pulse Rate 02/08/15 2333 77     Resp 02/08/15 2333 18     Temp 02/08/15 2333 98 F (36.7 C)     Temp Source 02/08/15 2333 Oral     SpO2 02/08/15 2333 100 %     Weight 02/08/15 2333 120 lb (54.432 kg)     Height 02/08/15 2333 5\' 4"  (1.626 m)     Head Cir --      Peak Flow --      Pain Score 02/08/15 2334 10   Constitutional: Alert and oriented. Well appearing and in no distress. Eyes: Conjunctivae are normal. PERRL. Normal extraocular movements. ENT   Head: Normocephalic and atraumatic.   Nose: No congestion/rhinnorhea.   Mouth/Throat: Mucous membranes are moist.   Neck: No stridor. Hematological/Lymphatic/Immunilogical:  No cervical lymphadenopathy. Cardiovascular: Normal rate, regular rhythm.  Respiratory: Normal respiratory effort without tachypnea nor retractions.  Gastrointestinal: Soft and nontender. No distention.  Genitourinary: Deferred Musculoskeletal: Normal range of motion in all extremities. No joint effusions.  No lower extremity tenderness nor edema. Neurologic:  Normal speech and language. No gross focal neurologic deficits are appreciated.  Skin:  Small area of redness located bilateral upper but talks. Tender to palpation. Bedside ultrasound does not show any collection of fluid under the skin. Psychiatric: Mood and affect are  normal. Speech and behavior are normal. Patient exhibits appropriate insight and judgment.  ____________________________________________    LABS (pertinent positives/negatives)  None  ____________________________________________   EKG  None  ____________________________________________    RADIOLOGY  None  ____________________________________________   PROCEDURES  Procedure(s) performed: None  Critical Care performed: No  ____________________________________________   INITIAL IMPRESSION / ASSESSMENT AND PLAN / ED COURSE  Pertinent labs & imaging results that were available during my care of the patient were reviewed by me and considered in my medical decision making (see chart for details).  Patient presented to the emergency department today because of concerns for pain and bilateral upper buttocks. On exam the locations of the pain are consistent with cellulitis. Think this likely secondary to patient not cleaning skin prior to injection. I did advise patient to clean her skin and going forward. Will give patient antibiotics.  ____________________________________________   FINAL CLINICAL IMPRESSION(S) / ED DIAGNOSES  Final diagnoses:  Bilateral low back pain without sciatica  Cellulitis of buttock     Nance Pear, MD 02/09/15 0300

## 2015-02-09 NOTE — ED Notes (Signed)
Pt reports lower back pain x 1 day.  Pt reports it's due to insulin shots, has been using insulin x 2 years but has not had this problem before.  Pt localizes pain to two small areas, each on a buttock.  Area pt indicates feels hard, like a nodule, about 1" diameter, tender to touch.  PT NAD at this time, respirations equal and unlabored, skin warm and dry.  Assessment facilitated by rafael, interpreter.

## 2015-02-09 NOTE — Discharge Instructions (Signed)
Please seek medical attention for any high fevers, chest pain, shortness of breath, change in behavior, persistent vomiting, bloody stool or any other new or concerning symptoms.   Celulitis (Cellulitis) La celulitis es una infeccin de la piel y del tejido subyacente. El rea infectada generalmente est de color rojo y duele. Ocurre con ms frecuencia en los brazos y en las piernas.  CAUSAS  La causa es una bacteria que ingresa en la piel a travs de grietas o cortes. Los tipos ms frecuentes de bacterias que causan celulitis son los estafilococos y los estreptococos. SIGNOS Y SNTOMAS   Enrojecimiento y calor.  Hinchazn.  Sensibilidad o dolor.  Cristy Hilts. DIAGNSTICO  Por lo general, el mdico puede diagnosticar esta afeccin con un examen fsico. Es posible que le indiquen anlisis de [place]. TRATAMIENTO  El tratamiento consiste en tomar antibiticos. [place] los antibiticos como le indic el mdico. Termine los antibiticos aunque comience a sentirse mejor.  Mantenga el brazo o la pierna elevados para reducir la hinchazn.  Aplique paos calientes en la zona hasta 4 veces al da para Glass blower/designer.  Tome los medicamentos solamente como se lo haya indicado el mdico.  Concurra a todas las visitas de control como se lo haya indicado el mdico. SOLICITE ATENCIN MDICA SI:   [place]argette Fast lnea roja en la piel que sale desde la zona infectada.  El rea roja se extiende o se vuelve de color oscuro.  El hueso o la articulacin que se encuentran por debajo de la zona infectada le duelen despus de que la piel se [place].  La infeccin se repite en la misma zona o en una zona diferente.  Tiene un bulto inflamado en la zona infectada.  Presenta nuevos sntomas.  Tiene fiebre. SOLICITE ATENCIN MDICA DE INMEDIATO SI:   Se siente muy somnoliento.  Tiene vmitos o diarrea.  Se siente enfermo (malestar general) con dolores  musculares.   Esta informacin no tiene Marine scientist el consejo del mdico. Asegrese de hacerle al mdico cualquier pregunta que tenga.   Document Released: 10/14/2004 Document Revised: 09/25/2014 Elsevier Interactive Patient Education [employer].

## 2015-02-10 ENCOUNTER — Emergency Department
Admission: EM | Admit: 2015-02-10 | Discharge: 2015-02-10 | Disposition: A | Discharge status: Home / Self Care | Payer: Self-pay | Attending: Emergency Medicine | Admitting: Emergency Medicine

## 2015-02-10 ENCOUNTER — Encounter: Payer: Self-pay | Admitting: Emergency Medicine

## 2015-02-10 DIAGNOSIS — Z791 Long term (current) use of non-steroidal anti-inflammatories (NSAID): Secondary | ICD-10-CM | POA: Insufficient documentation

## 2015-02-10 DIAGNOSIS — D649 Anemia, unspecified: Secondary | ICD-10-CM | POA: Insufficient documentation

## 2015-02-10 DIAGNOSIS — Z3202 Encounter for pregnancy test, result negative: Secondary | ICD-10-CM | POA: Insufficient documentation

## 2015-02-10 DIAGNOSIS — E119 Type 2 diabetes mellitus without complications: Secondary | ICD-10-CM | POA: Insufficient documentation

## 2015-02-10 DIAGNOSIS — L03317 Cellulitis of buttock: Secondary | ICD-10-CM | POA: Insufficient documentation

## 2015-02-10 DIAGNOSIS — Z794 Long term (current) use of insulin: Secondary | ICD-10-CM | POA: Insufficient documentation

## 2015-02-10 LAB — COMPREHENSIVE METABOLIC PANEL
ALK PHOS: 87 U/L (ref 38–126)
ALT: 15 U/L (ref 14–54)
AST: 14 U/L — ABNORMAL LOW (ref 15–41)
Albumin: 4.5 g/dL (ref 3.5–5.0)
Anion gap: 10 (ref 5–15)
BILIRUBIN TOTAL: 0.1 mg/dL — AB (ref 0.3–1.2)
BUN: 19 mg/dL (ref 6–20)
CALCIUM: 9.5 mg/dL (ref 8.9–10.3)
CO2: 24 mmol/L (ref 22–32)
CREATININE: 0.45 mg/dL (ref 0.44–1.00)
Chloride: 104 mmol/L (ref 101–111)
Glucose, Bld: 177 mg/dL — ABNORMAL HIGH (ref 65–99)
Potassium: 3.7 mmol/L (ref 3.5–5.1)
Sodium: 138 mmol/L (ref 135–145)
TOTAL PROTEIN: 9 g/dL — AB (ref 6.5–8.1)

## 2015-02-10 LAB — URINALYSIS COMPLETE WITH MICROSCOPIC (ARMC ONLY)
BILIRUBIN URINE: NEGATIVE
Bacteria, UA: NONE SEEN
Hgb urine dipstick: NEGATIVE
KETONES UR: NEGATIVE mg/dL
Leukocytes, UA: NEGATIVE
NITRITE: NEGATIVE
PROTEIN: NEGATIVE mg/dL
SPECIFIC GRAVITY, URINE: 1.033 — AB (ref 1.005–1.030)
pH: 5 (ref 5.0–8.0)

## 2015-02-10 LAB — CBC WITH DIFFERENTIAL/PLATELET
BASOS PCT: 1 %
Basophils Absolute: 0.1 10*3/uL (ref 0–0.1)
EOS ABS: 0.1 10*3/uL (ref 0–0.7)
Eosinophils Relative: 1 %
HCT: 29.6 % — ABNORMAL LOW (ref 35.0–47.0)
HEMOGLOBIN: 9.3 g/dL — AB (ref 12.0–16.0)
Lymphocytes Relative: 13 %
Lymphs Abs: 1.2 10*3/uL (ref 1.0–3.6)
MCH: 20.2 pg — ABNORMAL LOW (ref 26.0–34.0)
MCHC: 31.4 g/dL — AB (ref 32.0–36.0)
MCV: 64.4 fL — ABNORMAL LOW (ref 80.0–100.0)
MONOS PCT: 8 %
Monocytes Absolute: 0.7 10*3/uL (ref 0.2–0.9)
NEUTROS PCT: 77 %
Neutro Abs: 7.2 10*3/uL — ABNORMAL HIGH (ref 1.4–6.5)
Platelets: 474 10*3/uL — ABNORMAL HIGH (ref 150–440)
RBC: 4.59 MIL/uL (ref 3.80–5.20)
RDW: 17.5 % — ABNORMAL HIGH (ref 11.5–14.5)
WBC: 9.4 10*3/uL (ref 3.6–11.0)

## 2015-02-10 LAB — POCT PREGNANCY, URINE: PREG TEST UR: NEGATIVE

## 2015-02-10 MED ORDER — ONDANSETRON HCL 4 MG/2ML IJ SOLN
4.0000 mg | Freq: Once | INTRAMUSCULAR | Status: AC
  Administered 2015-02-10: 4 mg via INTRAVENOUS
  Filled 2015-02-10: qty 2

## 2015-02-10 MED ORDER — SODIUM CHLORIDE 0.9 % IV BOLUS (SEPSIS)
1000.0000 mL | Freq: Once | INTRAVENOUS | Status: AC
  Administered 2015-02-10: 1000 mL via INTRAVENOUS

## 2015-02-10 MED ORDER — MORPHINE SULFATE (PF) 4 MG/ML IV SOLN
4.0000 mg | Freq: Once | INTRAVENOUS | Status: AC
  Administered 2015-02-10: 4 mg via INTRAVENOUS
  Filled 2015-02-10: qty 1

## 2015-02-10 MED ORDER — HYDROCODONE-ACETAMINOPHEN 5-325 MG PO TABS: 1.0000 | ORAL_TABLET | ORAL | Status: DC | PRN

## 2015-02-10 MED ORDER — CLINDAMYCIN HCL 300 MG PO CAPS: 300.0000 mg | ORAL_CAPSULE | Freq: Three times a day (TID) | ORAL | Status: DC

## 2015-02-10 MED ORDER — CLINDAMYCIN PHOSPHATE 600 MG/50ML IV SOLN
600.0000 mg | Freq: Once | INTRAVENOUS | Status: AC
  Administered 2015-02-10: 600 mg via INTRAVENOUS
  Filled 2015-02-10: qty 50

## 2015-02-10 NOTE — ED Provider Notes (Signed)
[facility] Emergency Department Provider Note ____________________________________________  Time seen: Approximately 7:49 AM  I have reviewed the triage vital signs and the nursing notes.   HISTORY  Chief Complaint Back Pain   HPI Caitlyn Vincent is a 35 y.o. female who presents to the emergency department for evaluation of pain. She is an insulin-dependent diabetic and has been injecting her insulin in bilateral upper and outer edges of the buttocks. She has been experiencing redness and pain. She was here on Saturday for the same and given prescription for Keflex and ibuprofen. She states that she has been taking her antibiotic as prescribed, but has not improved. She denies fever, nausea, or vomiting.   Past Medical History  Diagnosis Date  . Diabetes mellitus without complication ([place])     There are no active problems to display for this patient.   Past Surgical History  Procedure Laterality Date  . Cholecystectomy      Current Outpatient Rx  Name  Route  Sig  Dispense  Refill  . clindamycin (CLEOCIN) 300 MG capsule   Oral   Take 1 capsule (300 mg total) by mouth 3 (three) times daily.   30 capsule   0   . cyclobenzaprine (FLEXERIL) 5 MG tablet   Oral   Take 1 tablet (5 mg total) by mouth every 8 (eight) hours as needed for muscle spasms.   12 tablet   0   . HYDROcodone-acetaminophen (NORCO/VICODIN) 5-325 MG tablet   Oral   Take 1 tablet by mouth every 4 (four) hours as needed for moderate pain.   12 tablet   0   . ibuprofen (ADVIL,MOTRIN) 600 MG tablet   Oral   Take 1 tablet (600 mg total) by mouth every 8 (eight) hours as needed.   20 tablet   0   . insulin regular (NOVOLIN R,HUMULIN R) 100 units/mL injection   Subcutaneous   Inject 0.2 mLs (20 Units total) into the skin 3 (three) times daily before meals. Patient taking differently: Inject 25 Units into the skin 3 (three) times daily before meals.    10 mL   0   .  ketorolac (TORADOL) 10 MG tablet   Oral   Take 1 tablet (10 mg total) by mouth every 8 (eight) hours.   15 tablet   0     Allergies Review of patient's allergies indicates no known allergies.  No family history on file.  Social History Social History  Substance Use Topics  . Smoking status: Never Smoker   . Smokeless tobacco: None  . Alcohol Use: Yes    Review of Systems   Constitutional: No fever/chills Eyes: No visual changes. ENT: No congestion or rhinorrhea Cardiovascular: Denies chest pain. Respiratory: Denies shortness of breath. Gastrointestinal: No abdominal pain.  No nausea, no vomiting.  No diarrhea.  No constipation. Genitourinary: Negative for dysuria. Musculoskeletal: Negative for back pain. Skin: Positive for pain to bilateral buttocks. Neurological: Negative for headaches, focal weakness or numbness.  10-point ROS otherwise unremarkable.  ____________________________________________   PHYSICAL EXAM:  VITAL SIGNS: ED Triage Vitals  Enc Vitals Group     BP 02/10/15 0714 113/59 mmHg     Pulse Rate 02/10/15 0714 88     Resp 02/10/15 0714 18     Temp 02/10/15 0714 98.3 F (36.8 C)     Temp Source 02/10/15 0714 Oral     SpO2 02/10/15 0714 98 %     Weight 02/10/15 0714 120 lb (54.432  kg)     Height 02/10/15 0714 5\' 2"  (1.575 m)     Head Cir --      Peak Flow --      Pain Score 02/10/15 0715 10     Pain Loc --      Pain Edu? --      Excl. in [place]? --     Constitutional: Alert and oriented. Uncomfortable appearing and in no acute distress. Eyes: Conjunctivae are normal. PERRL. EOMI. Head: Atraumatic. Nose: No congestion/rhinnorhea. Mouth/Throat: Mucous membranes are moist.  Oropharynx non-erythematous. No oral lesions. Neck: No stridor. Cardiovascular: .Good peripheral circulation. Respiratory: Normal respiratory effort.  No retractions. Gastrointestinal: Soft and nontender. No distention.  Musculoskeletal: No lower extremity tenderness nor  edema.  No joint effusions. Neurologic:  Normal speech and language. No gross focal neurologic deficits are appreciated. Speech is normal. No gait instability. Skin:  Approximately 5 cm erythematous indurated area noted to the left upper outer buttock; approximately 8 cm erythematous indurated area noted to right upper outer buttock--neither are fluctuant; Negative for petechiae.  Psychiatric: Mood and affect are normal. Speech and behavior are normal.  ____________________________________________   LABS (all labs ordered are listed, but only abnormal results are displayed)  Labs Reviewed  CBC WITH DIFFERENTIAL/PLATELET - Abnormal; Notable for the following:    Hemoglobin 9.3 (*)    HCT 29.6 (*)    MCV 64.4 (*)    MCH 20.2 (*)    MCHC 31.4 (*)    RDW 17.5 (*)    Platelets 474 (*)    Neutro Abs 7.2 (*)    All other components within normal limits  URINALYSIS COMPLETEWITH MICROSCOPIC ([facility] ONLY) - Abnormal; Notable for the following:    Color, Urine YELLOW (*)    APPearance CLEAR (*)    Glucose, UA >500 (*)    Specific Gravity, Urine 1.033 (*)    Squamous Epithelial / LPF 0-5 (*)    All other components within normal limits  COMPREHENSIVE METABOLIC PANEL - Abnormal; Notable for the following:    Glucose, Bld 177 (*)    Total Protein 9.0 (*)    AST 14 (*)    Total Bilirubin 0.1 (*)    All other components within normal limits  POC URINE PREG, ED  POCT PREGNANCY, URINE   ____________________________________________  EKG   ____________________________________________  RADIOLOGY  Not indicated. ____________________________________________   PROCEDURES  Procedure(s) performed: None ____________________________________________   INITIAL IMPRESSION / ASSESSMENT AND PLAN / ED COURSE  Pertinent labs & imaging results that were available during my care of the patient were reviewed by me and considered in my medical decision making (see chart for details).  Patient  was given Morphine, IV Clindamycin, and 1 liter of NS while in the ER. She will stop taking the Keflex and start the Clindamycin. She was also given Norco for pain. She was advised to follow-up with her primary care provider in 2 days for a recheck. Insulin administration instructions were discussed. Patient has not been cleaning her skin prior to injecting insulin. She does state that she is using a fresh needle each time, but is not rotating the injection site and has continued to inject in the infected area.  Patient was advised to return to the emergency department for symptoms that change or worsen if she is unable schedule an appointment with her primary care provider. ____________________________________________   FINAL CLINICAL IMPRESSION(S) / ED DIAGNOSES  Final diagnoses:  Cellulitis of buttock, left  Cellulitis of buttock,  right       Victorino Dike, FNP 02/10/15 1527  Carrie Mew, MD 02/11/15 2118

## 2015-02-10 NOTE — ED Notes (Signed)
Pt was told to give herself an insulin shot in her right and  Left lower back, pain has been uncontrolled even though was seen here for the same Sat night and given cipro and ibuprofen. Pt tearful in triage, interpretor used.

## 2015-02-10 NOTE — ED Notes (Signed)
Pt presents with pain to low back at each side. Pt states she is an insulin dependent diabetic and the pain is where she has been administering her insulin. Red, raised and circular areas noted to both hips. Areas are painful to touch. Pt states was seen Saturday and given Keflex but the pain has increased.

## 2015-02-10 NOTE — Discharge Instructions (Signed)
Celulitis (Cellulitis) La celulitis es una infeccin de la piel y del tejido subyacente. El rea infectada generalmente est de color rojo y duele. Ocurre con ms frecuencia en los brazos y en las piernas.  CAUSAS  La causa es una bacteria que ingresa en la piel a travs de grietas o cortes. Los tipos ms frecuentes de bacterias que causan celulitis son los estafilococos y los estreptococos. SIGNOS Y SNTOMAS   Enrojecimiento y calor.  Hinchazn.  Sensibilidad o dolor.  Fiebre. DIAGNSTICO  Por lo general, el mdico puede diagnosticar esta afeccin con un examen fsico. Es posible que le indiquen anlisis de sangre. TRATAMIENTO  El tratamiento consiste en tomar antibiticos. INSTRUCCIONES PARA EL CUIDADO EN EL HOGAR   Tome los antibiticos como le indic el mdico. Termine los antibiticos aunque comience a sentirse mejor.  Mantenga el brazo o la pierna elevados para reducir la hinchazn.  Aplique paos calientes en la zona hasta 4 veces al da para calmar el dolor.  Tome los medicamentos solamente como se lo haya indicado el mdico.  Concurra a todas las visitas de control como se lo haya indicado el mdico. SOLICITE ATENCIN MDICA SI:   Observa una lnea roja en la piel que sale desde la zona infectada.  El rea roja se extiende o se vuelve de color oscuro.  El hueso o la articulacin que se encuentran por debajo de la zona infectada le duelen despus de que la piel se cura.  La infeccin se repite en la misma zona o en una zona diferente.  Tiene un bulto inflamado en la zona infectada.  Presenta nuevos sntomas.  Tiene fiebre. SOLICITE ATENCIN MDICA DE INMEDIATO SI:   Se siente muy somnoliento.  Tiene vmitos o diarrea.  Se siente enfermo (malestar general) con dolores musculares.   Esta informacin no tiene como fin reemplazar el consejo del mdico. Asegrese de hacerle al mdico cualquier pregunta que tenga.   Document Released: 10/14/2004 Document  Revised: 09/25/2014 Elsevier Interactive Patient Education 2016 Elsevier Inc.  

## 2015-02-18 ENCOUNTER — Encounter: Payer: Self-pay | Admitting: *Deleted

## 2015-02-18 ENCOUNTER — Emergency Department
Admission: EM | Admit: 2015-02-18 | Discharge: 2015-02-18 | Disposition: A | Discharge status: Home / Self Care | Payer: Self-pay | Attending: Emergency Medicine | Admitting: Emergency Medicine

## 2015-02-18 DIAGNOSIS — Z794 Long term (current) use of insulin: Secondary | ICD-10-CM | POA: Insufficient documentation

## 2015-02-18 DIAGNOSIS — Z3202 Encounter for pregnancy test, result negative: Secondary | ICD-10-CM | POA: Insufficient documentation

## 2015-02-18 DIAGNOSIS — D649 Anemia, unspecified: Secondary | ICD-10-CM | POA: Insufficient documentation

## 2015-02-18 DIAGNOSIS — R739 Hyperglycemia, unspecified: Secondary | ICD-10-CM

## 2015-02-18 DIAGNOSIS — E1165 Type 2 diabetes mellitus with hyperglycemia: Secondary | ICD-10-CM | POA: Insufficient documentation

## 2015-02-18 DIAGNOSIS — Z791 Long term (current) use of non-steroidal anti-inflammatories (NSAID): Secondary | ICD-10-CM | POA: Insufficient documentation

## 2015-02-18 DIAGNOSIS — N751 Abscess of Bartholin's gland: Secondary | ICD-10-CM | POA: Insufficient documentation

## 2015-02-18 DIAGNOSIS — Z792 Long term (current) use of antibiotics: Secondary | ICD-10-CM | POA: Insufficient documentation

## 2015-02-18 LAB — CBC
HCT: 24 % — ABNORMAL LOW (ref 35.0–47.0)
HEMOGLOBIN: 7.6 g/dL — AB (ref 12.0–16.0)
MCH: 20.2 pg — ABNORMAL LOW (ref 26.0–34.0)
MCHC: 31.8 g/dL — AB (ref 32.0–36.0)
MCV: 63.7 fL — ABNORMAL LOW (ref 80.0–100.0)
PLATELETS: 549 10*3/uL — AB (ref 150–440)
RBC: 3.78 MIL/uL — ABNORMAL LOW (ref 3.80–5.20)
RDW: 17.3 % — AB (ref 11.5–14.5)
WBC: 9 10*3/uL (ref 3.6–11.0)

## 2015-02-18 LAB — URINALYSIS COMPLETE WITH MICROSCOPIC (ARMC ONLY)
BILIRUBIN URINE: NEGATIVE
Bacteria, UA: NONE SEEN
HGB URINE DIPSTICK: NEGATIVE
Leukocytes, UA: NEGATIVE
NITRITE: NEGATIVE
Protein, ur: NEGATIVE mg/dL
SPECIFIC GRAVITY, URINE: 1.027 (ref 1.005–1.030)
pH: 6 (ref 5.0–8.0)

## 2015-02-18 LAB — POCT PREGNANCY, URINE: PREG TEST UR: NEGATIVE

## 2015-02-18 LAB — BASIC METABOLIC PANEL
ANION GAP: 10 (ref 5–15)
BUN: 19 mg/dL (ref 6–20)
CALCIUM: 9.1 mg/dL (ref 8.9–10.3)
CO2: 23 mmol/L (ref 22–32)
CREATININE: 0.6 mg/dL (ref 0.44–1.00)
Chloride: 100 mmol/L — ABNORMAL LOW (ref 101–111)
GFR calc Af Amer: >60 mL/min (ref >60)
GLUCOSE: 417 mg/dL — AB (ref 65–99)
Potassium: 4.2 mmol/L (ref 3.5–5.1)
Sodium: 133 mmol/L — ABNORMAL LOW (ref 135–145)

## 2015-02-18 LAB — GLUCOSE, CAPILLARY: GLUCOSE-CAPILLARY: 388 mg/dL — AB (ref 65–99)

## 2015-02-18 MED ORDER — MORPHINE SULFATE (PF) 4 MG/ML IV SOLN
INTRAVENOUS | Status: AC
  Filled 2015-02-18: qty 1

## 2015-02-18 MED ORDER — SULFAMETHOXAZOLE-TRIMETHOPRIM 800-160 MG PO TABS: 1.0000 | ORAL_TABLET | Freq: Two times a day (BID) | ORAL | Status: DC

## 2015-02-18 MED ORDER — LIDOCAINE HCL (PF) 1 % IJ SOLN
INTRAMUSCULAR | Status: AC
  Administered 2015-02-18: 5 mL
  Filled 2015-02-18: qty 5

## 2015-02-18 MED ORDER — SODIUM CHLORIDE 0.9 % IV BOLUS (SEPSIS)
1000.0000 mL | Freq: Once | INTRAVENOUS | Status: AC
  Administered 2015-02-18: 1000 mL via INTRAVENOUS

## 2015-02-18 MED ORDER — MORPHINE SULFATE (PF) 4 MG/ML IV SOLN
4.0000 mg | Freq: Once | INTRAVENOUS | Status: AC
  Administered 2015-02-18: 4 mg via INTRAVENOUS

## 2015-02-18 MED ORDER — LIDOCAINE HCL (PF) 1 % IJ SOLN
5.0000 mL | Freq: Once | INTRAMUSCULAR | Status: AC
  Administered 2015-02-18: 5 mL

## 2015-02-18 NOTE — ED Notes (Signed)
Pt reports vaginal swell, inflammation and pain x 1 week. Pt currently on menstrual cycle. Pt also reports that she has type 1 DM and her blood glucose was reading 500's at home.

## 2015-02-18 NOTE — ED Notes (Addendum)
Patient presents to the ED for hyperglycemia.  Patient reports checking her blood sugar and it was over 500.  Patient reports that she is currently on her period and she states she has swelling to her groin.  Patient states this area is very painful.  Patient first noticed the swelling yesterday.  Patient is a type 1 diabetic who takes insulin.  Patient states blood sugar was normal yesterday.  Patient has had blood sugars this high in the past.  Patient appears very uncomfortable.

## 2015-02-18 NOTE — ED Provider Notes (Signed)
[facility] Emergency Department Provider Note   ____________________________________________  Time seen: ~1920  I have reviewed the triage vital signs and the nursing notes.   HISTORY  Chief Complaint Hyperglycemia and Vaginal Pain   History limited by: [facility] Interpreter utilized   HPI Caitlyn Vincent is a 35 y.o. female with history of diabetes who presents to the emergency department today with primary complaint of vaginal swelling and pain as well as high blood sugar. The patient states that she started having pain roughly 2 days ago. She states that since that time the pain has progressively gotten worse. There has been some associated swelling. She states it is very tender to touch. She has not noticed any abnormal vaginal drainage. She denies any fevers. Denies any nausea or vomiting.     Past Medical History  Diagnosis Date  . Diabetes mellitus without complication ([place])     There are no active problems to display for this patient.   Past Surgical History  Procedure Laterality Date  . Cholecystectomy      Current Outpatient Rx  Name  Route  Sig  Dispense  Refill  . clindamycin (CLEOCIN) 300 MG capsule   Oral   Take 1 capsule (300 mg total) by mouth 3 (three) times daily.   30 capsule   0   . cyclobenzaprine (FLEXERIL) 5 MG tablet   Oral   Take 1 tablet (5 mg total) by mouth every 8 (eight) hours as needed for muscle spasms.   12 tablet   0   . HYDROcodone-acetaminophen (NORCO/VICODIN) 5-325 MG tablet   Oral   Take 1 tablet by mouth every 4 (four) hours as needed for moderate pain.   12 tablet   0   . ibuprofen (ADVIL,MOTRIN) 600 MG tablet   Oral   Take 1 tablet (600 mg total) by mouth every 8 (eight) hours as needed.   20 tablet   0   . insulin regular (NOVOLIN R,HUMULIN R) 100 units/mL injection   Subcutaneous   Inject 0.2 mLs (20 Units total) into the skin 3 (three) times daily before  meals. Patient taking differently: Inject 25 Units into the skin 3 (three) times daily before meals.    10 mL   0   . ketorolac (TORADOL) 10 MG tablet   Oral   Take 1 tablet (10 mg total) by mouth every 8 (eight) hours.   15 tablet   0     Allergies Review of patient's allergies indicates no known allergies.  History reviewed. No pertinent family history.  Social History Social History  Substance Use Topics  . Smoking status: Never Smoker   . Smokeless tobacco: None  . Alcohol Use: Yes    Review of Systems  Constitutional: Negative for fever. Cardiovascular: Negative for chest pain. Respiratory: Negative for shortness of breath. Gastrointestinal: Negative for abdominal pain, vomiting and diarrhea. Genitourinary: Negative for dysuria. Skin: Painful swelling to left labia minora Neurological: Negative for headaches, focal weakness or numbness.   10-point ROS otherwise negative.  ____________________________________________   PHYSICAL EXAM:  VITAL SIGNS: ED Triage Vitals  Enc Vitals Group     BP 02/18/15 1425 98/78 mmHg     Pulse Rate 02/18/15 1425 82     Resp 02/18/15 1425 18     Temp 02/18/15 1425 98.7 F (37.1 C)     Temp Source 02/18/15 1425 Oral     SpO2 02/18/15 1425 100 %     Weight  02/18/15 1425 120 lb (54.432 kg)     Height 02/18/15 1425 5\' 2"  (1.575 m)     Head Cir --      Peak Flow --      Pain Score 02/18/15 1438 10   Constitutional: Alert and oriented. Well appearing and in no distress. Eyes: Conjunctivae are normal. PERRL. Normal extraocular movements. ENT   Head: Normocephalic and atraumatic.   Nose: No congestion/rhinnorhea.   Mouth/Throat: Mucous membranes are moist.   Neck: No stridor. Hematological/Lymphatic/Immunilogical: No cervical lymphadenopathy. Cardiovascular: Normal rate, regular rhythm.  No murmurs, rubs, or gallops. Respiratory: Normal respiratory effort without tachypnea nor retractions. Breath sounds are  clear and equal bilaterally. No wheezes/rales/rhonchi. Gastrointestinal: Soft and nontender. No distention.  Genitourinary: Swelling and tenderness to the left labia minora consistent with Bartholin's gland abscess. Musculoskeletal: Normal range of motion in all extremities. No joint effusions.  No lower extremity tenderness nor edema. Neurologic:  Normal speech and language. No gross focal neurologic deficits are appreciated.  Skin:  Skin is warm, dry and intact. No rash noted. Psychiatric: Mood and affect are normal. Speech and behavior are normal. Patient exhibits appropriate insight and judgment.  ____________________________________________    LABS (pertinent positives/negatives)  Labs Reviewed  GLUCOSE, CAPILLARY - Abnormal; Notable for the following:    Glucose-Capillary 388 (*)    All other components within normal limits  BASIC METABOLIC PANEL - Abnormal; Notable for the following:    Sodium 133 (*)    Chloride 100 (*)    Glucose, Bld 417 (*)    All other components within normal limits  CBC - Abnormal; Notable for the following:    RBC 3.78 (*)    Hemoglobin 7.6 (*)    HCT 24.0 (*)    MCV 63.7 (*)    MCH 20.2 (*)    MCHC 31.8 (*)    RDW 17.3 (*)    Platelets 549 (*)    All other components within normal limits  URINALYSIS COMPLETEWITH MICROSCOPIC ([facility] ONLY) - Abnormal; Notable for the following:    Color, Urine STRAW (*)    APPearance CLEAR (*)    Glucose, UA >500 (*)    Ketones, ur TRACE (*)    Squamous Epithelial / LPF 0-5 (*)    All other components within normal limits  BLOOD GAS, VENOUS - Abnormal; Notable for the following:    pCO2, Ven 39 (*)    All other components within normal limits  CBG MONITORING, ED  POCT PREGNANCY, URINE     ____________________________________________   EKG  None  ____________________________________________    RADIOLOGY  None   ____________________________________________   PROCEDURES  Procedure(s)  performed: Incision and drainage, see procedure note(s).  Critical Care performed: No   INCISION AND DRAINAGE Performed by: Nance Pear Consent: Verbal consent obtained. Risks and benefits: risks, benefits and alternatives were discussed Type: abscess  Body area: left labia  Anesthesia: local infiltration  Incision was made with a scalpel.  Local anesthetic: lidocaine 1% without epinephrine  Anesthetic total: 1 ml  Complexity: complex Blunt dissection to break up loculations  Drainage: purulent  Drainage amount: moderate  Word catheter placed  Patient tolerance: Patient tolerated the procedure well with no immediate complications.     ____________________________________________   INITIAL IMPRESSION / ASSESSMENT AND PLAN / ED COURSE  Pertinent labs & imaging results that were available during my care of the patient were reviewed by me and considered in my medical decision making (see chart for details).  Patient  presented to the emergency department today with primary concern for vaginal pain. The exam was consistent with Bartholin's gland abscess and incision and drainage with Word catheter placement was performed. Patient tolerated the procedure well and did have improvement of the pain. Additionally the patient's blood sugars were elevated however no concerning signs for diabetic ketoacidosis. Furthermore her blood work did show anemia. I discussed this with the patient states she has a known history of anemia. Discussed with patient importance of following up with OB/GYN for the Bartholin's cyst abscess as well as following up with her primary care doctor.  ____________________________________________   FINAL CLINICAL IMPRESSION(S) / ED DIAGNOSES  Final diagnoses:  Anemia, unspecified anemia type  Bartholin's gland abscess  Hyperglycemia     Nance Pear, MD 02/18/15 2306

## 2015-02-18 NOTE — Discharge Instructions (Signed)
Please seek medical attention for any high fevers, chest pain, shortness of breath, change in behavior, persistent vomiting, bloody stool or any other new or concerning symptoms.   Quiste o absceso de [place] (Bartholin Cyst or Abscess) Un quiste de Bartolino es un saco lleno de lquido que se forma en una glndula de [place]. Las glndulas de Bartolino son pequeas glndulas que se encuentran entre los pliegues de la piel (labios vaginales) a ambos lados del orificio inferior de la vagina. Estas glndulas secretan un lquido que humedece la parte externa de la vagina durante las relaciones sexuales. Un quiste de [employer] produce un bulto al costado de la vagina. Un quiste que no es grande ni est infectado puede no causar sntomas ni problemas. Sin embargo, si el lquido en su interior se infecta, el quiste puede convertirse en un absceso. Un absceso puede causar [employer]. CAUSAS Puede formarse un quiste de Bartolino cuando se obstruye el conducto de la glndula. En muchos de [employer], se desconoce la causa de esto. Diferentes tipos de bacterias pueden hacer que el quiste se infecte y se convierta en un absceso. FACTORES DE RIESGO Puede tener un riesgo ms alto de tener un quiste o un absceso de Bartolino si:  Es mujer y est en edad de Media planner.  Tiene antecedentes de quistes o abscesos de [place].  Tiene diabetes.  Tiene una enfermedad de trasmisin sexual (ETS). SIGNOS Y SNTOMAS La gravedad de los sntomas vara en funcin del tamao del quiste y de si est infectado. Entre los sntomas se pueden incluir los siguientes:  Un bulto o una hinchazn cerca del orificio inferior de la vagina.  Molestias o dolor.  Enrojecimiento.  [place].  Dolor al caminar.  Secrecin de lquido en la zona. DIAGNSTICO El mdico podr hacer el diagnstico en funcin de los sntomas y de un examen fsico. El mdico determinar si hay hinchazn en la zona  vaginal. Para verificar si hay infecciones, podrn hacerle anlisis de sangre. Adems, se puede tomar [place] del lquido del quiste para que la analicen en un laboratorio. TRATAMIENTO Es posible que los quistes pequeos que no estn infectados no requieran tratamiento. Estos suelen Publishing copy. El Gaffer baos calientes y el uso de compresas tibias, que tambin pueden ser parte del tratamiento de un absceso. Las opciones de tratamiento para un quiste grande pueden incluir lo siguiente:   Antibiticos.  Un procedimiento quirrgico para Doctor, hospital. Podr realizarse uno de los siguientes procedimientos:  Incisin y drenaje. Se realiza una incisin en el quiste o el absceso para que el lquido drene. Puede colocarse un catter dentro del quiste para evitar que se cierre y vuelva a llenarse de lquido. El catter se retirar despus de la visita de control a Teaching laboratory technician (gineclogo).  Marsupializacin. El quiste o el absceso se abren y se mantienen abiertos al suturar los bordes de la piel a las paredes del quiste o del absceso. Esto permite que sigan drenando y no se vuelvan a llenar de lquido. Si tiene quistes o abscesos que vuelven a formarse y han requerido incisin y Presenter, broadcasting, el mdico puede plantearle la posibilidad de que se someta a una ciruga para extirpar la glndula de [place]. [place] los medicamentos solamente como se lo haya indicado el mdico.  Si le recetaron antibiticos, asegrese de terminarlos, incluso si comienza a sentirse mejor.  Pngase compresas hmedas tibias en la zona  o hgase baos de asiento tibios que le cubran la zona plvica varias veces por da o como se lo haya indicado el mdico.  No se apriete el quiste ni le aplique mucha presin.  No tenga relaciones sexuales hasta tanto el quiste haya desaparecido.  Si le abrieron el quiste o el absceso, tal vez le hayan colocado  un pequeo trozo de gasa o un drenaje en la zona. No retire la gasa ni el drenaje hasta que el mdico se lo indique.  Use protectores femeninos, no tampones, si es necesario en caso de que haya supuracin o sangrado.  Concurra a todas las visitas de control como se lo haya indicado el mdico. Esto es importante. PREVENCIN Tome estas medidas para ayudar a Product/process development scientist que vuelva a formarse un quiste de Bartolino:  Mantenga una buena higiene.   Higienice la zona vaginal con un jabn y un pao suaves cuando se bae.  Practique el sexo seguro para evitar las ETS. SOLICITE ATENCIN MDICA SI:  Ha aumentado el dolor, la hinchazn o el enrojecimiento en la zona del quiste.  Tiene secrecin similar al pus que proviene del quiste.  Tiene fiebre.   Esta informacin no tiene Marine scientist el consejo del mdico. Asegrese de hacerle al mdico cualquier pregunta que tenga.   Document Released: 01/04/2005 Document Revised: 01/25/2014 Elsevier Interactive Patient Education [employer].

## 2015-02-18 NOTE — ED Notes (Signed)
Language interpreter at bedside with MD and RN to assess pt

## 2015-02-18 NOTE — ED Notes (Signed)
Discharge instructions gone over via interpreter

## 2015-02-18 NOTE — ED Notes (Signed)
Interpreter at bedside with MD and RN going over diagnosis and discharge instructions

## 2015-02-18 NOTE — ED Notes (Signed)
MD at bedside. 

## 2015-02-20 LAB — BLOOD GAS, VENOUS
ACID-BASE EXCESS: 0.8 mmol/L (ref 0.0–3.0)
Bicarbonate: 25.3 mEq/L (ref 21.0–28.0)
PATIENT TEMPERATURE: 37
pCO2, Ven: 39 mmHg — ABNORMAL LOW (ref 44.0–60.0)
pH, Ven: 7.42 (ref 7.320–7.430)

## 2015-02-20 LAB — CULTURE, ROUTINE-ABSCESS

## 2015-11-10 ENCOUNTER — Encounter: Payer: Self-pay | Admitting: Emergency Medicine

## 2015-11-10 ENCOUNTER — Emergency Department: Payer: BLUE CROSS/BLUE SHIELD

## 2015-11-10 ENCOUNTER — Emergency Department
Admission: EM | Admit: 2015-11-10 | Discharge: 2015-11-10 | Disposition: A | Discharge status: Home / Self Care | Payer: BLUE CROSS/BLUE SHIELD | Attending: Emergency Medicine | Admitting: Emergency Medicine

## 2015-11-10 DIAGNOSIS — R109 Unspecified abdominal pain: Secondary | ICD-10-CM | POA: Diagnosis present

## 2015-11-10 DIAGNOSIS — E1165 Type 2 diabetes mellitus with hyperglycemia: Secondary | ICD-10-CM | POA: Diagnosis not present

## 2015-11-10 DIAGNOSIS — N12 Tubulo-interstitial nephritis, not specified as acute or chronic: Secondary | ICD-10-CM

## 2015-11-10 DIAGNOSIS — R739 Hyperglycemia, unspecified: Secondary | ICD-10-CM

## 2015-11-10 LAB — COMPREHENSIVE METABOLIC PANEL
ALBUMIN: 3.7 g/dL (ref 3.5–5.0)
ALT: 12 U/L — ABNORMAL LOW (ref 14–54)
AST: 16 U/L (ref 15–41)
Alkaline Phosphatase: 68 U/L (ref 38–126)
Anion gap: 6 (ref 5–15)
BUN: 19 mg/dL (ref 6–20)
CHLORIDE: 102 mmol/L (ref 101–111)
CO2: 24 mmol/L (ref 22–32)
Calcium: 8.8 mg/dL — ABNORMAL LOW (ref 8.9–10.3)
Creatinine, Ser: 0.72 mg/dL (ref 0.44–1.00)
GFR calc Af Amer: >60 mL/min (ref >60)
GFR calc non Af Amer: >60 mL/min (ref >60)
GLUCOSE: 415 mg/dL — AB (ref 65–99)
POTASSIUM: 4.8 mmol/L (ref 3.5–5.1)
SODIUM: 132 mmol/L — AB (ref 135–145)
Total Bilirubin: 0.4 mg/dL (ref 0.3–1.2)
Total Protein: 7.2 g/dL (ref 6.5–8.1)

## 2015-11-10 LAB — URINALYSIS COMPLETE WITH MICROSCOPIC (ARMC ONLY)
Bilirubin Urine: NEGATIVE
Ketones, ur: NEGATIVE mg/dL
Nitrite: NEGATIVE
PROTEIN: 30 mg/dL — AB
Specific Gravity, Urine: 1.02 (ref 1.005–1.030)
pH: 6 (ref 5.0–8.0)

## 2015-11-10 LAB — CBC
HEMATOCRIT: 31.3 % — AB (ref 35.0–47.0)
Hemoglobin: 10.1 g/dL — ABNORMAL LOW (ref 12.0–16.0)
MCH: 22.6 pg — ABNORMAL LOW (ref 26.0–34.0)
MCHC: 32.3 g/dL (ref 32.0–36.0)
MCV: 70.2 fL — AB (ref 80.0–100.0)
Platelets: 354 10*3/uL (ref 150–440)
RBC: 4.46 MIL/uL (ref 3.80–5.20)
RDW: 17.8 % — AB (ref 11.5–14.5)
WBC: 8.1 10*3/uL (ref 3.6–11.0)

## 2015-11-10 LAB — POCT PREGNANCY, URINE: PREG TEST UR: NEGATIVE

## 2015-11-10 LAB — GLUCOSE, CAPILLARY: GLUCOSE-CAPILLARY: 187 mg/dL — AB (ref 65–99)

## 2015-11-10 LAB — LIPASE, BLOOD: LIPASE: 20 U/L (ref 11–51)

## 2015-11-10 MED ORDER — ONDANSETRON HCL 4 MG/2ML IJ SOLN
4.0000 mg | Freq: Once | INTRAMUSCULAR | Status: AC
  Administered 2015-11-10: 4 mg via INTRAVENOUS
  Filled 2015-11-10: qty 2

## 2015-11-10 MED ORDER — CEFTRIAXONE SODIUM 1 G IJ SOLR: 1.0000 g | Freq: Once | INTRAMUSCULAR | Status: DC

## 2015-11-10 MED ORDER — IOPAMIDOL (ISOVUE-300) INJECTION 61%
100.0000 mL | Freq: Once | INTRAVENOUS | Status: AC | PRN
  Administered 2015-11-10: 100 mL via INTRAVENOUS

## 2015-11-10 MED ORDER — CEFTRIAXONE SODIUM-DEXTROSE 1-3.74 GM-% IV SOLR
1.0000 g | Freq: Once | INTRAVENOUS | Status: AC
  Administered 2015-11-10: 1 g via INTRAVENOUS
  Filled 2015-11-10: qty 50

## 2015-11-10 MED ORDER — INSULIN ASPART 100 UNIT/ML ~~LOC~~ SOLN
5.0000 [IU] | Freq: Once | SUBCUTANEOUS | Status: AC
  Administered 2015-11-10: 5 [IU] via SUBCUTANEOUS
  Filled 2015-11-10: qty 5

## 2015-11-10 MED ORDER — CEPHALEXIN 500 MG PO CAPS: 500.0000 mg | ORAL_CAPSULE | Freq: Four times a day (QID) | ORAL | 0 refills | Status: AC

## 2015-11-10 MED ORDER — MORPHINE SULFATE (PF) 2 MG/ML IV SOLN
4.0000 mg | Freq: Once | INTRAVENOUS | Status: AC
  Administered 2015-11-10: 4 mg via INTRAVENOUS
  Filled 2015-11-10: qty 2

## 2015-11-10 MED ORDER — SODIUM CHLORIDE 0.9 % IV BOLUS (SEPSIS)
1000.0000 mL | Freq: Once | INTRAVENOUS | Status: AC
  Administered 2015-11-10: 1000 mL via INTRAVENOUS
  Filled 2015-11-10: qty 1000

## 2015-11-10 MED ORDER — HYDROCODONE-ACETAMINOPHEN 5-325 MG PO TABS: 1.0000 | ORAL_TABLET | Freq: Four times a day (QID) | ORAL | 0 refills | Status: DC | PRN

## 2015-11-10 NOTE — Discharge Instructions (Signed)
Take keflex four times for 10 days.   Take vicodin for pain.   See your doctor  Check your blood sugar frequently.   Stay hydrated  Return to ER if you have vomiting, severe pain, fevers, vomiting.

## 2015-11-10 NOTE — ED Triage Notes (Signed)
C/O mid abdominal pain since yesterday afternoon.  The pain has been worsening and now the pain is a lot.  Denies N/V/D.  Denies dysuria.  Last BM last night.

## 2015-11-10 NOTE — ED Notes (Signed)
Pt resting quietly on bed. Call bell in reach. No complaints.

## 2015-11-10 NOTE — ED Notes (Signed)
MD at the bedside. Per interpreter, pt started with abdominal pain yesterday. Pt reports pain mid abdomen that radiates around to her right flank. Pt describes the pain as crampy. Pt denies fevers, dysuria or vomitting. Pt reports is diabetic so has been urinating more frequently. Pt denies blood in urine.

## 2015-11-10 NOTE — ED Notes (Signed)
E-sig pad not working, Pt verbalized consent and understanding of D/C instructions

## 2015-11-10 NOTE — ED Provider Notes (Signed)
[facility] Provider Note   CSN: [id number] Arrival date & time: 11/10/15  [id number]     History   Chief Complaint Chief Complaint  Patient presents with  . Abdominal Pain    HPI Caitlyn Vincent is a 35 y.o. female hx of DM, here with abdominal pain, frequency. Patient states that she's been having periumbilical pain and right flank pain since yesterday. States that the pain is constant and crampy and radiates to the flank. Denies any nausea or vomiting. Denies any dysuria but patient urinates very frequently. She is on insulin at baseline for her diabetes and her blood sugar has been elevated in the 300s since yesterday. Denies any fevers or chills.   The history is provided by the patient. The history is limited by a language barrier. A language interpreter was used.   Spanish interpreter used.   Past Medical History:  Diagnosis Date  . Diabetes mellitus without complication ([place])     There are no active problems to display for this patient.   Past Surgical History:  Procedure Laterality Date  . CHOLECYSTECTOMY      OB History    No data available       Home Medications    Prior to Admission medications   Medication Sig Start Date End Date Taking? Authorizing Provider  ibuprofen (ADVIL,MOTRIN) 600 MG tablet Take 1 tablet (600 mg total) by mouth every 8 (eight) hours as needed. 02/09/15  Yes Nance Pear, MD  insulin regular (NOVOLIN R,HUMULIN R) 100 units/mL injection Inject 25 Units into the skin 3 (three) times daily before meals.   Yes Historical Provider, MD    Family History No family history on file.  Social History Social History  Substance Use Topics  . Smoking status: Never Smoker  . Smokeless tobacco: Never Used  . Alcohol use Yes     Allergies   Review of patient's allergies indicates no known allergies.   Review of Systems Review of Systems  Gastrointestinal: Positive for abdominal pain.  All other systems reviewed and  are negative.    Physical Exam Updated Vital Signs BP (!) 100/50 (BP Location: Right Arm)   Pulse 71   Temp 98.2 F (36.8 C) (Oral)   Resp 20   Ht 5\' 2"  (1.575 m)   Wt 130 lb (59 kg)   LMP 10/30/2015   SpO2 100%   BMI 23.78 kg/m   Physical Exam  Constitutional: She is oriented to person, place, and time. She appears well-nourished.  Slightly uncomfortable   HENT:  Head: Normocephalic.  MM slightly dry   Eyes: EOM are normal. Pupils are equal, round, and reactive to light.  Neck: Normal range of motion. Neck supple.  Cardiovascular: Normal rate, regular rhythm and normal heart sounds.   Pulmonary/Chest: Effort normal and breath sounds normal. No respiratory distress. She has no wheezes. She has no rales.  Abdominal: Soft. Bowel sounds are normal.  Mild periumbilical and R CVAT   Musculoskeletal: Normal range of motion.  Neurological: She is alert and oriented to person, place, and time.  Skin: Skin is warm.  Psychiatric: She has a normal mood and affect.  Nursing note and vitals reviewed.    ED Treatments / Results  Labs (all labs ordered are listed, but only abnormal results are displayed) Labs Reviewed  COMPREHENSIVE METABOLIC PANEL - Abnormal; Notable for the following:       Result Value   Sodium 132 (*)    Glucose, Bld 415 (*)  Calcium 8.8 (*)    ALT 12 (*)    All other components within normal limits  CBC - Abnormal; Notable for the following:    Hemoglobin 10.1 (*)    HCT 31.3 (*)    MCV 70.2 (*)    MCH 22.6 (*)    RDW 17.8 (*)    All other components within normal limits  URINALYSIS COMPLETEWITH MICROSCOPIC ([facility] ONLY) - Abnormal; Notable for the following:    Color, Urine STRAW (*)    APPearance HAZY (*)    Glucose, UA >500 (*)    Hgb urine dipstick 1+ (*)    Protein, ur 30 (*)    Leukocytes, UA 1+ (*)    Bacteria, UA RARE (*)    Squamous Epithelial / LPF 6-30 (*)    All other components within normal limits  GLUCOSE, CAPILLARY - Abnormal;  Notable for the following:    Glucose-Capillary 187 (*)    All other components within normal limits  URINE CULTURE  LIPASE, BLOOD  POC URINE PREG, ED  POCT PREGNANCY, URINE  CBG MONITORING, ED    EKG  EKG Interpretation None       Radiology Ct Abdomen Pelvis W Wo Contrast  Result Date: 11/10/2015 CLINICAL DATA:  Right-sided flank pain beginning yesterday. EXAM: CT ABDOMEN AND PELVIS WITHOUT AND WITH CONTRAST TECHNIQUE: Multidetector CT imaging of the abdomen and pelvis was performed following the standard protocol before and following the bolus administration of intravenous contrast. CONTRAST:  121mL ISOVUE-300 IOPAMIDOL (ISOVUE-300) INJECTION 61% COMPARISON:  None. FINDINGS: Lower Chest: No acute findings. Hepatobiliary: No mass identified. Prior cholecystectomy noted. Mild prominence of intrahepatic bile ducts is noted with common bile duct measuring approximately 10 mm which is at the upper limits of normal status post cholecystectomy. Pancreas:  No mass or inflammatory changes. Spleen: Within normal limits in size and appearance. Adrenals/Urinary Tract: No adrenal masses identified. No evidence of urolithiasis or hydronephrosis. No complex cystic or solid renal masses are identified. No masses seen involving the ureters or bladder. Stomach/Bowel: No evidence of obstruction, inflammatory process or abnormal fluid collections. Vascular/Lymphatic: No pathologically enlarged lymph nodes. No abdominal aortic aneurysm. Reproductive:  No mass identified. Other:  None. Musculoskeletal:  No suspicious bone lesions identified. IMPRESSION: No acute findings within the abdomen or pelvis. Prior cholecystectomy. Mild prominence of intra and extrahepatic bile ducts is likely secondary to prior cholecystectomy. Suggest correlation with liver function tests ; if abnormal, consider MRCP for further evaluation. Electronically Signed   By: Earle Gell M.D.   On: 11/10/2015 11:14    Procedures Procedures  (including critical care time)  Medications Ordered in ED Medications  sodium chloride 0.9 % bolus 1,000 mL (0 mLs Intravenous Stopped 11/10/15 1240)  morphine 2 MG/ML injection 4 mg (4 mg Intravenous Given 11/10/15 1024)  ondansetron (ZOFRAN) injection 4 mg (4 mg Intravenous Given 11/10/15 1025)  insulin aspart (novoLOG) injection 5 Units (5 Units Subcutaneous Given 11/10/15 1026)  cefTRIAXone (ROCEPHIN) IVPB 1 g (1 g Intravenous Given 11/10/15 1018)  iopamidol (ISOVUE-300) 61 % injection 100 mL (100 mLs Intravenous Contrast Given 11/10/15 1047)  morphine 2 MG/ML injection 4 mg (4 mg Intravenous Given 11/10/15 1231)     Initial Impression / Assessment and Plan / ED Course  I have reviewed the triage vital signs and the nursing notes.  Pertinent labs & imaging results that were available during my care of the patient were reviewed by me and considered in my medical decision making (see chart for  details).  Clinical Course    Caitlyn Vincent is a 35 y.o. female here with periumbilical pain, R flank pain. Consider stone vs pyelo vs early appy. Will get labs, UA, CT ab/pel.   1:07 PM WBC nl. Chemistry showed glucose 415 with nl AG. UA + too many to count WBC. CT ab/pel showed no stone or renal abscess. Given rocephin. Urine culture sent. Pain controlled. Vitals stable. Given 1 L NS bolus and 5 U SQ novolog and repeat cbg was 187. Will dc home with keflex, vicodin for pain.   Final Clinical Impressions(s) / ED Diagnoses   Final diagnoses:  Flank pain    New Prescriptions New Prescriptions   No medications on file     Drenda Freeze, MD 11/10/15 1308

## 2015-11-12 LAB — URINE CULTURE

## 2016-06-28 DIAGNOSIS — E1065 Type 1 diabetes mellitus with hyperglycemia: Secondary | ICD-10-CM | POA: Insufficient documentation

## 2016-08-20 ENCOUNTER — Encounter: Payer: Self-pay | Admitting: Medical Oncology

## 2016-08-20 ENCOUNTER — Emergency Department
Admission: EM | Admit: 2016-08-20 | Discharge: 2016-08-20 | Disposition: A | Discharge status: Home / Self Care | Payer: Worker's Compensation | Attending: Emergency Medicine | Admitting: Emergency Medicine

## 2016-08-20 ENCOUNTER — Emergency Department: Payer: Worker's Compensation

## 2016-08-20 DIAGNOSIS — Z794 Long term (current) use of insulin: Secondary | ICD-10-CM | POA: Diagnosis not present

## 2016-08-20 DIAGNOSIS — E119 Type 2 diabetes mellitus without complications: Secondary | ICD-10-CM | POA: Diagnosis not present

## 2016-08-20 DIAGNOSIS — Y9301 Activity, walking, marching and hiking: Secondary | ICD-10-CM | POA: Diagnosis not present

## 2016-08-20 DIAGNOSIS — Y990 Civilian activity done for income or pay: Secondary | ICD-10-CM | POA: Insufficient documentation

## 2016-08-20 DIAGNOSIS — W010XXA Fall on same level from slipping, tripping and stumbling without subsequent striking against object, initial encounter: Secondary | ICD-10-CM | POA: Insufficient documentation

## 2016-08-20 DIAGNOSIS — Y929 Unspecified place or not applicable: Secondary | ICD-10-CM | POA: Insufficient documentation

## 2016-08-20 DIAGNOSIS — S300XXA Contusion of lower back and pelvis, initial encounter: Secondary | ICD-10-CM | POA: Diagnosis not present

## 2016-08-20 DIAGNOSIS — S3992XA Unspecified injury of lower back, initial encounter: Secondary | ICD-10-CM | POA: Diagnosis present

## 2016-08-20 LAB — URINALYSIS, COMPLETE (UACMP) WITH MICROSCOPIC
Bacteria, UA: NONE SEEN
Bilirubin Urine: NEGATIVE
Glucose, UA: >=500 mg/dL — AB
Hgb urine dipstick: NEGATIVE
KETONES UR: 5 mg/dL — AB
Leukocytes, UA: NEGATIVE
Nitrite: NEGATIVE
PH: 5 (ref 5.0–8.0)
Protein, ur: NEGATIVE mg/dL
SPECIFIC GRAVITY, URINE: 1.027 (ref 1.005–1.030)

## 2016-08-20 LAB — POCT PREGNANCY, URINE: Preg Test, Ur: NEGATIVE

## 2016-08-20 MED ORDER — ONDANSETRON 8 MG PO TBDP
ORAL_TABLET | ORAL | Status: AC
  Filled 2016-08-20: qty 1

## 2016-08-20 MED ORDER — TRAMADOL HCL 50 MG PO TABS: 50.0000 mg | ORAL_TABLET | Freq: Four times a day (QID) | ORAL | 0 refills | Status: DC | PRN

## 2016-08-20 MED ORDER — ONDANSETRON 8 MG PO TBDP
8.0000 mg | ORAL_TABLET | Freq: Once | ORAL | Status: AC
  Administered 2016-08-20: 8 mg via ORAL

## 2016-08-20 MED ORDER — IBUPROFEN 600 MG PO TABS: 600.0000 mg | ORAL_TABLET | Freq: Three times a day (TID) | ORAL | 0 refills | Status: DC | PRN

## 2016-08-20 MED ORDER — KETOROLAC TROMETHAMINE 60 MG/2ML IM SOLN
30.0000 mg | Freq: Once | INTRAMUSCULAR | Status: AC
  Administered 2016-08-20: 30 mg via INTRAMUSCULAR
  Filled 2016-08-20: qty 2

## 2016-08-20 MED ORDER — HYDROMORPHONE HCL 1 MG/ML IJ SOLN
1.0000 mg | Freq: Once | INTRAMUSCULAR | Status: AC
  Administered 2016-08-20: 1 mg via INTRAMUSCULAR
  Filled 2016-08-20: qty 1

## 2016-08-20 MED ORDER — ORPHENADRINE CITRATE 30 MG/ML IJ SOLN
60.0000 mg | Freq: Two times a day (BID) | INTRAMUSCULAR | Status: DC
  Administered 2016-08-20: 60 mg via INTRAMUSCULAR
  Filled 2016-08-20: qty 2

## 2016-08-20 NOTE — ED Provider Notes (Signed)
[facility] Emergency Department Provider Note   ____________________________________________   First MD Initiated Contact with Patient 08/20/16 1222     (approximate)  I have reviewed the triage vital signs and the nursing notes.   HISTORY  Chief Complaint Fall and Tailbone Pain    HPI via interpreter Caitlyn Vincent is a 36 y.o. female patient complaining of coccyx pain secondary to a fall. Patient states she slipped and fell at work. Patient state unable to stand without assistance. Patient denies radicular component to her pain denies bladder or bowel dysfunction. Patient stated pain increases with flexion of the knees. Patient rating pain as a 10 over 10. Patient described as "sharp". Patient states any movement increases the pain. No palliative measures prior to arrival.   Past Medical History:  Diagnosis Date  . Diabetes mellitus without complication ([place])     There are no active problems to display for this patient.   Past Surgical History:  Procedure Laterality Date  . CHOLECYSTECTOMY      Prior to Admission medications   Medication Sig Start Date End Date Taking? Authorizing Provider  HYDROcodone-acetaminophen (NORCO/VICODIN) 5-325 MG tablet Take 1 tablet by mouth every 6 (six) hours as needed for moderate pain. 11/10/15   Drenda Freeze, MD  ibuprofen (ADVIL,MOTRIN) 600 MG tablet Take 1 tablet (600 mg total) by mouth every 8 (eight) hours as needed. 02/09/15   Nance Pear, MD  ibuprofen (ADVIL,MOTRIN) 600 MG tablet Take 1 tablet (600 mg total) by mouth every 8 (eight) hours as needed. 08/20/16   Sable Feil, PA-C  insulin regular (NOVOLIN R,HUMULIN R) 100 units/mL injection Inject 25 Units into the skin 3 (three) times daily before meals.    [provider]  traMADol (ULTRAM) 50 MG tablet Take 1 tablet (50 mg total) by mouth every 6 (six) hours as needed for moderate pain. 08/20/16   Sable Feil, PA-C     Allergies Patient has no known allergies.  No family history on file.  Social History Social History  Substance Use Topics  . Smoking status: Never Smoker  . Smokeless tobacco: Never Used  . Alcohol use Yes    Review of Systems  Constitutional: No fever/chills Eyes: No visual changes. ENT: No sore throat. Cardiovascular: Denies chest pain. Respiratory: Denies shortness of breath. Gastrointestinal: No abdominal pain.  No nausea, no vomiting.  No diarrhea.  No constipation. Genitourinary: Negative for dysuria. Musculoskeletal: Negative for back pain. Skin: Negative for rash. Neurological: Negative for headaches, focal weakness or numbness. Endocrine:Diabetes ____________________________________________   PHYSICAL EXAM:  VITAL SIGNS: ED Triage Vitals  Enc Vitals Group     BP 08/20/16 1208 101/84     Pulse Rate 08/20/16 1208 81     Resp 08/20/16 1208 19     Temp 08/20/16 1208 98.7 F (37.1 C)     Temp Source 08/20/16 1208 Oral     SpO2 08/20/16 1208 99 %     Weight 08/20/16 1207 130 lb (59 kg)     Height --      Head Circumference --      Peak Flow --      Pain Score 08/20/16 1207 10     Pain Loc --      Pain Edu? --      Excl. in [place]? --     Constitutional: Alert and oriented. Well appearing and in no acute distress. Eyes: Conjunctivae are normal. PERRL. EOMI. Head: Atraumatic. Nose: No congestion/rhinnorhea. Mouth/Throat:  Mucous membranes are moist.  Oropharynx non-erythematous. Neck: No stridor.  No cervical spine tenderness to palpation. Hematological/Lymphatic/Immunilogical: No cervical lymphadenopathy. Cardiovascular: Normal rate, regular rhythm. Grossly normal heart sounds.  Good peripheral circulation. Respiratory: Normal respiratory effort.  No retractions. Lungs CTAB. Gastrointestinal: Soft and nontender. No distention. No abdominal bruits. No CVA tenderness. Musculoskeletal: No spinal deformity. Patient is moderate guarding palpation L4-S1. No  lower extremity tenderness nor edema.  No joint effusions. Neurologic:  Normal speech and language. No gross focal neurologic deficits are appreciated. No gait instability. Skin:  Skin is warm, dry and intact. No rash noted. Psychiatric: Mood and affect are normal. Speech and behavior are normal.  ____________________________________________   LABS (all labs ordered are listed, but only abnormal results are displayed)  Labs Reviewed  URINALYSIS, COMPLETE (UACMP) WITH MICROSCOPIC - Abnormal; Notable for the following:       Result Value   Color, Urine YELLOW (*)    APPearance CLEAR (*)    Glucose, UA >=500 (*)    Ketones, ur 5 (*)    Squamous Epithelial / LPF 0-5 (*)    All other components within normal limits  POCT PREGNANCY, URINE   ____________________________________________  EKG   ____________________________________________  RADIOLOGY  Dg Lumbar Spine Complete  Result Date: 08/20/2016 CLINICAL DATA:  Low back and coccyx pain following a fall today. EXAM: LUMBAR SPINE - COMPLETE 4+ VIEW COMPARISON:  Abdomen and pelvis CT dated 11/10/2015. FINDINGS: Five non rib-bearing lumbar vertebrae. Mild anterior spur formation at the L3-4 level and minimal anterior spur formation at the L2-3 level. No fractures, pars defects or subluxations. Cholecystectomy clips. IMPRESSION: No fracture or subluxation.  Minimal degenerative changes. Electronically Signed   By: Claudie Revering M.D.   On: 08/20/2016 14:18    _No acute final x-rays and lumbar spine. ___________________________________________   PROCEDURES  Procedure(s) performed: None  Procedures  Critical Care performed: No  ____________________________________________   INITIAL IMPRESSION / ASSESSMENT AND PLAN / ED COURSE  Pertinent labs & imaging results that were available during my care of the patient were reviewed by me and considered in my medical decision making (see chart for details). Coccyx contusion. Patient  reports to ED status. Follow blood. Tailbone pain. Discussed negative x-ray findings on lumbar spine. Patient given discharge Instructions and a work note. Patient advised to take medication as directed. Patient advised follow-up Worker's Compensation doctor if condition persists.      ____________________________________________   FINAL CLINICAL IMPRESSION(S) / ED DIAGNOSES  Final diagnoses:  Coccyx contusion, initial encounter      NEW MEDICATIONS STARTED DURING THIS VISIT:  New Prescriptions   IBUPROFEN (ADVIL,MOTRIN) 600 MG TABLET    Take 1 tablet (600 mg total) by mouth every 8 (eight) hours as needed.   TRAMADOL (ULTRAM) 50 MG TABLET    Take 1 tablet (50 mg total) by mouth every 6 (six) hours as needed for moderate pain.     Note:  This document was prepared using Dragon voice recognition software and may include unintentional dictation errors.    Sable Feil, PA-C 08/20/16 1427    Earleen Newport, MD 08/20/16 1500

## 2016-08-20 NOTE — ED Triage Notes (Signed)
Pt was at work when she slipped and fell, c/o pain to tail bone. Denies head injury.

## 2016-08-20 NOTE — ED Notes (Signed)
Patient made aware of need of urine sample x2. One for UA and one for workers comp. Patient verbalizes understanding.

## 2016-08-20 NOTE — ED Notes (Signed)
Patient 's boss return with no ID, starts drug screen patient does not know social or employee ID number boss has his ID and ID patient ,Call Sharyn Blitz., OUT OF OFFICE, BILL MEBANE no answer call Rudene Anda [phone] ,she states use patient's birth , patient states she understands ,DRUG SCREEN  completed and walked to lab.

## 2016-08-20 NOTE — ED Notes (Signed)
Awaiting patients boss to return to ED to identify patient to continue with workers comp required drug screen. Patient does not have her ID on her. He was called and instructed to verify the patient and to bring ID.

## 2016-08-26 ENCOUNTER — Encounter: Payer: Self-pay | Admitting: Emergency Medicine

## 2016-08-26 ENCOUNTER — Emergency Department: Payer: Worker's Compensation

## 2016-08-26 ENCOUNTER — Emergency Department
Admission: EM | Admit: 2016-08-26 | Discharge: 2016-08-26 | Disposition: A | Discharge status: Home / Self Care | Payer: Worker's Compensation | Attending: Emergency Medicine | Admitting: Emergency Medicine

## 2016-08-26 DIAGNOSIS — Z794 Long term (current) use of insulin: Secondary | ICD-10-CM | POA: Diagnosis not present

## 2016-08-26 DIAGNOSIS — X58XXXD Exposure to other specified factors, subsequent encounter: Secondary | ICD-10-CM | POA: Diagnosis not present

## 2016-08-26 DIAGNOSIS — E119 Type 2 diabetes mellitus without complications: Secondary | ICD-10-CM | POA: Insufficient documentation

## 2016-08-26 DIAGNOSIS — K5903 Drug induced constipation: Secondary | ICD-10-CM

## 2016-08-26 DIAGNOSIS — K5900 Constipation, unspecified: Secondary | ICD-10-CM | POA: Diagnosis present

## 2016-08-26 DIAGNOSIS — S3992XD Unspecified injury of lower back, subsequent encounter: Secondary | ICD-10-CM | POA: Diagnosis not present

## 2016-08-26 DIAGNOSIS — M533 Sacrococcygeal disorders, not elsewhere classified: Secondary | ICD-10-CM

## 2016-08-26 DIAGNOSIS — T402X5A Adverse effect of other opioids, initial encounter: Secondary | ICD-10-CM | POA: Insufficient documentation

## 2016-08-26 LAB — POCT PREGNANCY, URINE: PREG TEST UR: NEGATIVE

## 2016-08-26 MED ORDER — NAPROXEN 500 MG PO TABS: 500.0000 mg | ORAL_TABLET | Freq: Two times a day (BID) | ORAL | 0 refills | Status: AC

## 2016-08-26 MED ORDER — KETOROLAC TROMETHAMINE 60 MG/2ML IM SOLN
30.0000 mg | Freq: Once | INTRAMUSCULAR | Status: AC
  Administered 2016-08-26: 30 mg via INTRAMUSCULAR
  Filled 2016-08-26: qty 2

## 2016-08-26 MED ORDER — CYCLOBENZAPRINE HCL 10 MG PO TABS: 10.0000 mg | ORAL_TABLET | Freq: Three times a day (TID) | ORAL | 0 refills | Status: DC | PRN

## 2016-08-26 NOTE — ED Notes (Addendum)
Pt denies request for interpreter, pt verbalizes understanidng of d.c teaching and rx. Pt in NAD at this time. Pt unable to sign due to signature pad malfnx

## 2016-08-26 NOTE — ED Triage Notes (Signed)
Pt was here for work related injury last Friday and dx with coccyx contusion. Pt here today because has not had bowel movement since last Friday.  Still has pain to coccyx. ambulatory to triage.  VSS

## 2016-08-26 NOTE — Discharge Instructions (Signed)
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 Tome senna una vez al da a la hora de [place]. Tome probiticos a diario. Beba muchos lquidos y [place] una dieta rica en fibra. Tome 1 cpsula llena de [employer] veces al da durante 3-5 das  Constipation: Take colace twice a day everyday. Take senna once a day at bedtime. Take daily probiotics. Drink plenty of fluids and eat a diet rich in fiber. Take 1 cap full of Miralax twice a day for 3-5 days

## 2016-08-26 NOTE — ED Notes (Signed)
Dr. Veronese at bedside 

## 2016-08-26 NOTE — ED Provider Notes (Signed)
[facility] Emergency Department Provider Note  ____________________________________________  Time seen: Approximately 6:30 PM  I have reviewed the triage vital signs and the nursing notes.   HISTORY  Chief Complaint Constipation   HPI Caitlyn Vincent is a 36 y.o. female who presents for evaluation of constipation. Patient was seen here6 days ago after a fall at work with coccyx pain. She was started on tramadol which has been taking at home. Patient endorses 6 days of constipation. She has only tried 2 Dulcolax pills at home. She denies abdominal pain, nausea, vomiting. Patient has had a cholecystectomy in the past. No abdominal distention or prior history of SBO. She is passing flatus. Patient continues to complain of pain in her coccyx region that has been constant since her fall 6 days ago, worse with movement and walking, currently moderate in intensity. No lower back pain, no saddle anesthesia, no urinary or bowel incontinence or retention, no leg weakness or numbness.  Past Medical History:  Diagnosis Date  . Diabetes mellitus without complication ([place])     There are no active problems to display for this patient.   Past Surgical History:  Procedure Laterality Date  . CHOLECYSTECTOMY      Prior to Admission medications   Medication Sig Start Date End Date Taking? Authorizing Provider  cyclobenzaprine (FLEXERIL) 10 MG tablet Take 1 tablet (10 mg total) by mouth 3 (three) times daily as needed for muscle spasms. 08/26/16   Rudene Re, MD  HYDROcodone-acetaminophen (NORCO/VICODIN) 5-325 MG tablet Take 1 tablet by mouth every 6 (six) hours as needed for moderate pain. 11/10/15   Drenda Freeze, MD  insulin regular (NOVOLIN R,HUMULIN R) 100 units/mL injection Inject 25 Units into the skin 3 (three) times daily before meals.    [provider]  naproxen (NAPROSYN) 500 MG tablet Take 1 tablet (500 mg total) by mouth 2 (two) times daily  with a meal. 08/26/16 08/26/17  Rudene Re, MD    Allergies Patient has no known allergies.  History reviewed. No pertinent family history.  Social History Social History  Substance Use Topics  . Smoking status: Never Smoker  . Smokeless tobacco: Never Used  . Alcohol use Yes    Review of Systems  Constitutional: Negative for fever. Eyes: Negative for visual changes. ENT: Negative for sore throat. Neck: No neck pain  Cardiovascular: Negative for chest pain. Respiratory: Negative for shortness of breath. Gastrointestinal: Negative for abdominal pain, vomiting or diarrhea. + constipation Genitourinary: Negative for dysuria. Musculoskeletal: Negative for back pain. + coccyx pain Skin: Negative for rash. Neurological: Negative for headaches, weakness or numbness. Psych: No SI or HI  ____________________________________________   PHYSICAL EXAM:  VITAL SIGNS: ED Triage Vitals  Enc Vitals Group     BP 08/26/16 1732 119/63     Pulse Rate 08/26/16 1732 75     Resp 08/26/16 1732 16     Temp 08/26/16 1732 98.1 F (36.7 C)     Temp Source 08/26/16 1732 Oral     SpO2 08/26/16 1732 100 %     Weight 08/26/16 1732 130 lb (59 kg)     Height --      Head Circumference --      Peak Flow --      Pain Score 08/26/16 1733 8     Pain Loc --      Pain Edu? --      Excl. in [place]? --     Constitutional: Alert and oriented.  Well appearing and in no apparent distress. HEENT:      Head: Normocephalic and atraumatic.         Eyes: Conjunctivae are normal. Sclera is non-icteric.       Mouth/Throat: Mucous membranes are moist.       Neck: Supple with no signs of meningismus. Cardiovascular: Regular rate and rhythm. No murmurs, gallops, or rubs. 2+ symmetrical distal pulses are present in all extremities. No JVD. Respiratory: Normal respiratory effort. Lungs are clear to auscultation bilaterally. No wheezes, crackles, or rhonchi.  Gastrointestinal: Soft, non tender, and non distended  with positive bowel sounds. No rebound or guarding. Musculoskeletal: ttp over the coccyx region with no obvious injuries. No c/t/l spine ttp Neurologic: Normal speech and language. Face is symmetric. Moving all extremities. No gross focal neurologic deficits are appreciated. Skin: Skin is warm, dry and intact. No rash noted. Psychiatric: Mood and affect are normal. Speech and behavior are normal.  ____________________________________________   LABS (all labs ordered are listed, but only abnormal results are displayed)  Labs Reviewed  POCT PREGNANCY, URINE   ____________________________________________  EKG  none  ____________________________________________  RADIOLOGY  XR coccyx: Negative.  KUB: No acute findings. Moderate colonic stool burden. ____________________________________________   PROCEDURES  Procedure(s) performed: None Procedures Critical Care performed:  None ____________________________________________   INITIAL IMPRESSION / ASSESSMENT AND PLAN / ED COURSE  36 y.o. female who presents for evaluation of constipation in the setting of being on tramadol for recent injury. Patient well appearing, abdomen is soft with no tenderness throughout. She does have tenderness palpation of the coccyx region. No signs or symptoms of cauda equina. We'll do an x-ray of the coccyx to rule out fracture. We'll do a KUB. No signs or symptoms of SBO. Patient will need more intense bowel regimen while on tramadol.    _________________________ 7:25 PM on 08/26/2016 -----------------------------------------  KUB Concerning for moderate stool burden. Patient be discharged home on Colace, senna, MiraLAX, and increase by mouth hydration. Recommended stopping tramadol. X-ray of her coccyx negative for injury.  Pertinent labs & imaging results that were available during my care of the patient were reviewed by me and considered in my medical decision making (see chart for  details).    ____________________________________________   FINAL CLINICAL IMPRESSION(S) / ED DIAGNOSES  Final diagnoses:  Constipation due to opioid therapy  Coccyx pain      NEW MEDICATIONS STARTED DURING THIS VISIT:  New Prescriptions   CYCLOBENZAPRINE (FLEXERIL) 10 MG TABLET    Take 1 tablet (10 mg total) by mouth 3 (three) times daily as needed for muscle spasms.   NAPROXEN (NAPROSYN) 500 MG TABLET    Take 1 tablet (500 mg total) by mouth 2 (two) times daily with a meal.     Note:  This document was prepared using Dragon voice recognition software and may include unintentional dictation errors.    Alfred Levins [place], MD 08/26/16 [phone]

## 2017-01-19 ENCOUNTER — Encounter: Payer: Self-pay | Admitting: Emergency Medicine

## 2017-01-19 ENCOUNTER — Emergency Department
Admission: EM | Admit: 2017-01-19 | Discharge: 2017-01-19 | Disposition: A | Discharge status: Home / Self Care | Payer: BLUE CROSS/BLUE SHIELD | Attending: Emergency Medicine | Admitting: Emergency Medicine

## 2017-01-19 DIAGNOSIS — E119 Type 2 diabetes mellitus without complications: Secondary | ICD-10-CM | POA: Diagnosis not present

## 2017-01-19 DIAGNOSIS — R509 Fever, unspecified: Secondary | ICD-10-CM | POA: Diagnosis present

## 2017-01-19 DIAGNOSIS — J020 Streptococcal pharyngitis: Secondary | ICD-10-CM | POA: Diagnosis not present

## 2017-01-19 DIAGNOSIS — Z7984 Long term (current) use of oral hypoglycemic drugs: Secondary | ICD-10-CM | POA: Insufficient documentation

## 2017-01-19 LAB — INFLUENZA PANEL BY PCR (TYPE A & B)
Influenza A By PCR: NEGATIVE
Influenza B By PCR: NEGATIVE

## 2017-01-19 LAB — GROUP A STREP BY PCR: Group A Strep by PCR: DETECTED — AB

## 2017-01-19 MED ORDER — ACETAMINOPHEN 325 MG PO TABS
650.0000 mg | ORAL_TABLET | Freq: Once | ORAL | Status: AC | PRN
  Administered 2017-01-19: 650 mg via ORAL

## 2017-01-19 MED ORDER — LIDOCAINE VISCOUS 2 % MT SOLN: 10.0000 mL | OROMUCOSAL | 0 refills | Status: DC | PRN

## 2017-01-19 MED ORDER — ACETAMINOPHEN 325 MG PO TABS
ORAL_TABLET | ORAL | Status: AC
  Filled 2017-01-19: qty 2

## 2017-01-19 MED ORDER — PENICILLIN V POTASSIUM 500 MG PO TABS
500.0000 mg | ORAL_TABLET | Freq: Once | ORAL | Status: AC
  Administered 2017-01-19: 500 mg via ORAL
  Filled 2017-01-19: qty 1

## 2017-01-19 MED ORDER — KETOROLAC TROMETHAMINE 30 MG/ML IJ SOLN
30.0000 mg | Freq: Once | INTRAMUSCULAR | Status: AC
  Administered 2017-01-19: 30 mg via INTRAVENOUS
  Filled 2017-01-19: qty 1

## 2017-01-19 MED ORDER — PENICILLIN V POTASSIUM 500 MG PO TABS: 500.0000 mg | ORAL_TABLET | Freq: Three times a day (TID) | ORAL | 0 refills | Status: AC

## 2017-01-19 MED ORDER — LIDOCAINE VISCOUS 2 % MT SOLN
15.0000 mL | Freq: Once | OROMUCOSAL | Status: AC
  Administered 2017-01-19: 15 mL via OROMUCOSAL
  Filled 2017-01-19: qty 15

## 2017-01-19 NOTE — ED Provider Notes (Signed)
[facility] Emergency Department Provider Note  ____________________________________________  Time seen: Approximately 8:39 PM  I have reviewed the triage vital signs and the nursing notes.   HISTORY  Chief Complaint Fever and Cough    HPI Caitlyn Vincent is a 37 y.o. female that presents to the emergency department for evaluation of fever, chills, body aches, sore throat for 2 days.  She has an occasional cough but not frequent.  Fever has been 101-102 at home. No sick contacts.  She did not receive the flu vaccine this year.  She does not smoke.  No SOB, CP, nausea, vomiting, abdominal pain.  Past Medical History:  Diagnosis Date  . Diabetes mellitus without complication ([place])     There are no active problems to display for this patient.   Past Surgical History:  Procedure Laterality Date  . CHOLECYSTECTOMY      Prior to Admission medications   Medication Sig Start Date End Date Taking? Authorizing Provider  cyclobenzaprine (FLEXERIL) 10 MG tablet Take 1 tablet (10 mg total) by mouth 3 (three) times daily as needed for muscle spasms. 08/26/16   Rudene Re, MD  HYDROcodone-acetaminophen (NORCO/VICODIN) 5-325 MG tablet Take 1 tablet by mouth every 6 (six) hours as needed for moderate pain. 11/10/15   Drenda Freeze, MD  insulin regular (NOVOLIN R,HUMULIN R) 100 units/mL injection Inject 25 Units into the skin 3 (three) times daily before meals.    [provider]  lidocaine (XYLOCAINE) 2 % solution Use as directed 10 mLs in the mouth or throat as needed for mouth pain. 01/19/17   Laban Emperor, PA-C  naproxen (NAPROSYN) 500 MG tablet Take 1 tablet (500 mg total) by mouth 2 (two) times daily with a meal. 08/26/16 08/26/17  Alfred Levins, [place], MD  penicillin v potassium (VEETID) 500 MG tablet Take 1 tablet (500 mg total) by mouth 3 (three) times daily for 10 days. 01/19/17 01/29/17  Laban Emperor, PA-C    Allergies Patient has no known  allergies.  No family history on file.  Social History Social History   Tobacco Use  . Smoking status: Never Smoker  . Smokeless tobacco: Never Used  Substance Use Topics  . Alcohol use: Yes  . Drug use: No     Review of Systems  Eyes: No visual changes. No discharge. ENT: Negative for congestion and rhinorrhea. Cardiovascular: No chest pain. Respiratory: No SOB. Gastrointestinal: No abdominal pain.  No nausea, no vomiting.  No diarrhea.  No constipation. Musculoskeletal: Positive for body aches. Skin: Negative for rash, abrasions, lacerations, ecchymosis. Neurological: Positive for headache.   ____________________________________________   PHYSICAL EXAM:  VITAL SIGNS: ED Triage Vitals [01/19/17 1853]  Enc Vitals Group     BP 118/80     Pulse Rate (!) 113     Resp 20     Temp (!) 103.2 F (39.6 C)     Temp Source Oral     SpO2 100 %     Weight 135 lb (61.2 kg)     Height 5' 2.99" (1.6 m)     Head Circumference      Peak Flow      Pain Score 10     Pain Loc      Pain Edu?      Excl. in [place]?      Constitutional: Alert and oriented. Well appearing and in no acute distress. Eyes: Conjunctivae are normal. PERRL. EOMI. No discharge. Head: Atraumatic. ENT: No frontal and maxillary sinus tenderness.  Ears: Tympanic membranes pearly gray with good landmarks. No discharge.      Nose: No congestion/rhinnorhea.      Mouth/Throat: Mucous membranes are moist. Oropharynx erythematous. Tonsils mildly enlarged bilaterally. No exudates. Uvula midline. Neck: No stridor.   Hematological/Lymphatic/Immunilogical: No cervical lymphadenopathy. Cardiovascular: Normal rate, regular rhythm.  Good peripheral circulation. Respiratory: Normal respiratory effort without tachypnea or retractions. Lungs CTAB. Good air entry to the bases with no decreased or absent breath sounds. Gastrointestinal: Bowel sounds 4 quadrants. Soft and nontender to palpation. No guarding or rigidity. No  palpable masses. No distention. Musculoskeletal: Full range of motion to all extremities. No gross deformities appreciated. Neurologic:  Normal speech and language. No gross focal neurologic deficits are appreciated.  Skin:  Skin is warm, dry and intact. No rash noted.   ____________________________________________   LABS (all labs ordered are listed, but only abnormal results are displayed)  Labs Reviewed  GROUP A STREP BY PCR - Abnormal; Notable for the following components:      Result Value   Group A Strep by PCR DETECTED (*)    All other components within normal limits  INFLUENZA PANEL BY PCR (TYPE A & B)   ____________________________________________  EKG   ____________________________________________  RADIOLOGY   No results found.  ____________________________________________    PROCEDURES  Procedure(s) performed:    Procedures    Medications  acetaminophen (TYLENOL) tablet 650 mg (650 mg Oral Given 01/19/17 1856)  penicillin v potassium (VEETID) tablet 500 mg (500 mg Oral Given 01/19/17 2234)  ketorolac (TORADOL) 30 MG/ML injection 30 mg (30 mg Intravenous Given 01/19/17 2234)  lidocaine (XYLOCAINE) 2 % viscous mouth solution 15 mL (15 mLs Mouth/Throat Given 01/19/17 2234)     ____________________________________________   INITIAL IMPRESSION / ASSESSMENT AND PLAN / ED COURSE  Pertinent labs & imaging results that were available during my care of the patient were reviewed by me and considered in my medical decision making (see chart for details).  Review of the [place] CSRS was performed in accordance of the [place] prior to dispensing any controlled drugs.   Patient's diagnosis is consistent with strep pharyngitis. Vital signs and exam are reassuring.  Strep positive.  Influenza negative.  Patient was given Tylenol, penicillin, viscous lidocaine, Toradol in ED.  Patient appears well and is staying well hydrated. Patient should alternate tylenol and ibuprofen for  fever. Patient feels comfortable going home. Patient will be discharged home with prescriptions for penicillin, viscous lidocaine. Patient is to follow up with PCP as needed or otherwise directed. Patient is given ED precautions to return to the ED for any worsening or new symptoms.     ____________________________________________  FINAL CLINICAL IMPRESSION(S) / ED DIAGNOSES  Final diagnoses:  Strep pharyngitis      NEW MEDICATIONS STARTED DURING THIS VISIT:  ED Discharge Orders        Ordered    penicillin v potassium (VEETID) 500 MG tablet  3 times daily     01/19/17 2213    lidocaine (XYLOCAINE) 2 % solution  As needed     01/19/17 2213          This chart was dictated using voice recognition software/Dragon. Despite best efforts to proofread, errors can occur which can change the meaning. Any change was purely unintentional.    Laban Emperor, PA-C 01/19/17 2244    Harvest Dark, MD 01/19/17 2317

## 2017-01-19 NOTE — ED Triage Notes (Addendum)
Pt comes into the ED via POV c/o flu like symptoms with fever, cough, and congestion.  Patient states the last medication administration was this morning with nyquil.  Patient presents with chills and not feeling well at this time but is in NAD with even and unlabored respirations.

## 2018-03-08 ENCOUNTER — Emergency Department: Payer: Self-pay

## 2018-03-08 ENCOUNTER — Encounter: Payer: Self-pay | Admitting: Emergency Medicine

## 2018-03-08 ENCOUNTER — Emergency Department
Admission: EM | Admit: 2018-03-08 | Discharge: 2018-03-08 | Disposition: A | Discharge status: Home / Self Care | Payer: Self-pay | Attending: Emergency Medicine | Admitting: Emergency Medicine

## 2018-03-08 DIAGNOSIS — R51 Headache: Secondary | ICD-10-CM | POA: Insufficient documentation

## 2018-03-08 DIAGNOSIS — R519 Headache, unspecified: Secondary | ICD-10-CM

## 2018-03-08 DIAGNOSIS — E119 Type 2 diabetes mellitus without complications: Secondary | ICD-10-CM | POA: Insufficient documentation

## 2018-03-08 DIAGNOSIS — D500 Iron deficiency anemia secondary to blood loss (chronic): Secondary | ICD-10-CM

## 2018-03-08 DIAGNOSIS — R42 Dizziness and giddiness: Secondary | ICD-10-CM | POA: Insufficient documentation

## 2018-03-08 DIAGNOSIS — D509 Iron deficiency anemia, unspecified: Secondary | ICD-10-CM | POA: Insufficient documentation

## 2018-03-08 LAB — GLUCOSE, CAPILLARY: GLUCOSE-CAPILLARY: 235 mg/dL — AB (ref 70–99)

## 2018-03-08 LAB — COMPREHENSIVE METABOLIC PANEL
ALBUMIN: 3.9 g/dL (ref 3.5–5.0)
ALT: 15 U/L (ref 0–44)
ANION GAP: 5 (ref 5–15)
AST: 19 U/L (ref 15–41)
Alkaline Phosphatase: 51 U/L (ref 38–126)
BUN: 16 mg/dL (ref 6–20)
CO2: 24 mmol/L (ref 22–32)
Calcium: 8.7 mg/dL — ABNORMAL LOW (ref 8.9–10.3)
Chloride: 106 mmol/L (ref 98–111)
Creatinine, Ser: 0.49 mg/dL (ref 0.44–1.00)
GFR calc non Af Amer: >60 mL/min (ref >60)
Glucose, Bld: 279 mg/dL — ABNORMAL HIGH (ref 70–99)
POTASSIUM: 4 mmol/L (ref 3.5–5.1)
SODIUM: 135 mmol/L (ref 135–145)
TOTAL PROTEIN: 7.6 g/dL (ref 6.5–8.1)
Total Bilirubin: 0.5 mg/dL (ref 0.3–1.2)

## 2018-03-08 LAB — CBC
HCT: 29.7 % — ABNORMAL LOW (ref 36.0–46.0)
Hemoglobin: 8.8 g/dL — ABNORMAL LOW (ref 12.0–15.0)
MCH: 20 pg — ABNORMAL LOW (ref 26.0–34.0)
MCHC: 29.6 g/dL — ABNORMAL LOW (ref 30.0–36.0)
MCV: 67.7 fL — ABNORMAL LOW (ref 80.0–100.0)
Platelets: 464 10*3/uL — ABNORMAL HIGH (ref 150–400)
RBC: 4.39 MIL/uL (ref 3.87–5.11)
RDW: 17.9 % — AB (ref 11.5–15.5)
WBC: 7.7 10*3/uL (ref 4.0–10.5)
nRBC: 0 % (ref 0.0–0.2)

## 2018-03-08 LAB — IRON AND TIBC
Iron: 18 ug/dL — ABNORMAL LOW (ref 28–170)
SATURATION RATIOS: 4 % — AB (ref 10.4–31.8)
TIBC: 411 ug/dL (ref 250–450)
UIBC: 393 ug/dL

## 2018-03-08 LAB — FERRITIN: Ferritin: 3 ng/mL — ABNORMAL LOW (ref 11–307)

## 2018-03-08 MED ORDER — METOCLOPRAMIDE HCL 5 MG/ML IJ SOLN
10.0000 mg | Freq: Once | INTRAMUSCULAR | Status: AC
  Administered 2018-03-08: 10 mg via INTRAVENOUS
  Filled 2018-03-08: qty 2

## 2018-03-08 MED ORDER — KETOROLAC TROMETHAMINE 30 MG/ML IJ SOLN
30.0000 mg | Freq: Once | INTRAMUSCULAR | Status: AC
  Administered 2018-03-08: 30 mg via INTRAVENOUS
  Filled 2018-03-08: qty 1

## 2018-03-08 MED ORDER — FERROUS SULFATE DRIED ER 160 (50 FE) MG PO TBCR
160.0000 mg | EXTENDED_RELEASE_TABLET | Freq: Every day | ORAL | 6 refills | Status: DC
Start: 2018-03-08 — End: 2020-11-10

## 2018-03-08 MED ORDER — SODIUM CHLORIDE 0.9 % IV BOLUS
1000.0000 mL | Freq: Once | INTRAVENOUS | Status: AC
  Administered 2018-03-08: 1000 mL via INTRAVENOUS

## 2018-03-08 MED ORDER — BUTALBITAL-APAP-CAFFEINE 50-325-40 MG PO TABS: 1.0000 | ORAL_TABLET | Freq: Four times a day (QID) | ORAL | 0 refills | Status: AC | PRN

## 2018-03-08 MED ORDER — MECLIZINE HCL 25 MG PO TABS: 25.0000 mg | ORAL_TABLET | Freq: Three times a day (TID) | ORAL | 1 refills | Status: DC | PRN

## 2018-03-08 NOTE — ED Notes (Signed)
Signature PAD not available on portable computer.

## 2018-03-08 NOTE — ED Notes (Signed)
ED provider at bedside. Interpreter at bedside.

## 2018-03-08 NOTE — ED Provider Notes (Signed)
[facility] Emergency Department Provider Note       Time seen: ----------------------------------------- 9:20 AM on 03/08/2018 -----------------------------------------   I have reviewed the triage vital signs and the nursing notes.  HISTORY   Chief Complaint Headache    HPI Caitlyn Vincent is a 38 y.o. female with a history of diabetes who presents to the ED for posterior headache with some vomiting.  She has had a headache for the past 2 weeks.  Patient states she has used ibuprofen but is not currently helping.  She does have some dizziness and room spinning sensation.  She denies fevers, chills or other complaints.  Past Medical History:  Diagnosis Date  . Diabetes mellitus without complication ([place])     There are no active problems to display for this patient.   Past Surgical History:  Procedure Laterality Date  . CHOLECYSTECTOMY      Allergies Patient has no known allergies.  Social History Social History   Tobacco Use  . Smoking status: Never Smoker  . Smokeless tobacco: Never Used  Substance Use Topics  . Alcohol use: Yes  . Drug use: No   Review of Systems Constitutional: Negative for fever. Cardiovascular: Negative for chest pain. Respiratory: Negative for shortness of breath. Gastrointestinal: Negative for abdominal pain, vomiting and diarrhea. Musculoskeletal: Negative for back pain. Skin: Negative for rash. Neurological: Positive for headache  All systems negative/normal/unremarkable except as stated in the HPI  ____________________________________________   PHYSICAL EXAM:  VITAL SIGNS: ED Triage Vitals [03/08/18 0800]  Enc Vitals Group     BP (!) 111/44     Pulse Rate 79     Resp 20     Temp 98.1 F (36.7 C)     Temp Source Oral     SpO2 99 %     Weight 140 lb (63.5 kg)     Height 5\' 2"  (1.575 m)     Head Circumference      Peak Flow      Pain Score 8     Pain Loc      Pain Edu?      Excl. in [place]?     Constitutional: Alert and oriented. Well appearing and in no distress. Eyes: Conjunctivae are normal. Normal extraocular movements. ENT      Head: Normocephalic and atraumatic.      Nose: No congestion/rhinnorhea.      Mouth/Throat: Mucous membranes are moist.      Neck: No stridor. Cardiovascular: Normal rate, regular rhythm. No murmurs, rubs, or gallops. Respiratory: Normal respiratory effort without tachypnea nor retractions. Breath sounds are clear and equal bilaterally. No wheezes/rales/rhonchi. Gastrointestinal: Soft and nontender. Normal bowel sounds Musculoskeletal: Nontender with normal range of motion in extremities. No lower extremity tenderness nor edema. Neurologic:  Normal speech and language. No gross focal neurologic deficits are appreciated.  Skin:  Skin is warm, dry and intact. No rash noted. Psychiatric: Mood and affect are normal. Speech and behavior are normal.  ____________________________________________  EKG: Interpreted by me.  Sinus rhythm the rate of 77 bpm, normal PR interval, normal QRS, normal QT  ____________________________________________  ED COURSE:  As part of my medical decision making, I reviewed the following data within the [place] History obtained from family if available, nursing notes, old chart and ekg, as well as notes from prior ED visits. Patient presented for headache, we will assess with labs and imaging as indicated at this time.   Procedures ____________________________________________   LABS (  pertinent positives/negatives)  Labs Reviewed  GLUCOSE, CAPILLARY - Abnormal; Notable for the following components:      Result Value   Glucose-Capillary 235 (*)    All other components within normal limits  CBC - Abnormal; Notable for the following components:   Hemoglobin 8.8 (*)    HCT 29.7 (*)    MCV 67.7 (*)    MCH 20.0 (*)    MCHC 29.6 (*)    RDW 17.9 (*)    Platelets 464 (*)    All other components within  normal limits  COMPREHENSIVE METABOLIC PANEL - Abnormal; Notable for the following components:   Glucose, Bld 279 (*)    Calcium 8.7 (*)    All other components within normal limits  IRON AND TIBC - Abnormal; Notable for the following components:   Iron 18 (*)    Saturation Ratios 4 (*)    All other components within normal limits  FERRITIN - Abnormal; Notable for the following components:   Ferritin 3 (*)    All other components within normal limits  CBG MONITORING, ED    RADIOLOGY  CT head IMPRESSION: Brain parenchyma appears unremarkable.  No mass or hemorrhage.  There are foci of paranasal sinus disease. ____________________________________________   DIFFERENTIAL DIAGNOSIS   Migraine, tension headache, dehydration, electrolyte abnormality, meningitis unlikely, anemia  FINAL ASSESSMENT AND PLAN  Headache, vertigo, iron deficiency anemia   Plan: The patient had presented for posterior headache. Patient's labs did reveal significant iron deficiency anemia. Patient's imaging did not reveal any acute process.  Patient will be treated with iron and meclizine.  She is cleared for outpatient follow-up.   Laurence Aly, MD    Note: This note was generated in part or whole with voice recognition software. Voice recognition is usually quite accurate but there are transcription errors that can and very often do occur. I apologize for any typographical errors that were not detected and corrected.     Earleen Newport, MD 03/08/18 [phone]

## 2018-03-08 NOTE — ED Triage Notes (Signed)
Per interpreter, pt started with headache in the back of her head and has some vomiting. Pt states has used ibuprofen in the back with relief but it is not helping right now. Pt reports also has some dizziness. Pt has diabetes as well. Pt denies injuries and denies taking blood thinners.

## 2018-03-08 NOTE — ED Notes (Signed)
Refer to triage note. Pt c/o headache 10/10  that has started 2 weeks ago with dizziness/intermitten nausea. Per pt, she is up to date with her insulin intake at home. Pt is A/Ox4, NAD noted.

## 2018-03-10 ENCOUNTER — Ambulatory Visit: Payer: Self-pay | Admitting: Hematology and Oncology

## 2018-03-15 ENCOUNTER — Inpatient Hospital Stay: Payer: Self-pay | Admitting: Hematology and Oncology

## 2018-03-28 ENCOUNTER — Encounter: Payer: Self-pay | Admitting: Hematology and Oncology

## 2018-03-28 ENCOUNTER — Inpatient Hospital Stay: Payer: Self-pay | Attending: Hematology and Oncology | Admitting: Hematology and Oncology

## 2018-03-28 VITALS — BP 106/68 | HR 67 | Temp 97.4°F | Resp 18 | Ht 62.0 in | Wt 139.9 lb

## 2018-03-28 DIAGNOSIS — D509 Iron deficiency anemia, unspecified: Secondary | ICD-10-CM | POA: Insufficient documentation

## 2018-03-28 DIAGNOSIS — N920 Excessive and frequent menstruation with regular cycle: Secondary | ICD-10-CM | POA: Insufficient documentation

## 2018-03-28 DIAGNOSIS — Z794 Long term (current) use of insulin: Secondary | ICD-10-CM | POA: Insufficient documentation

## 2018-03-28 DIAGNOSIS — E119 Type 2 diabetes mellitus without complications: Secondary | ICD-10-CM | POA: Insufficient documentation

## 2018-03-28 DIAGNOSIS — Z79899 Other long term (current) drug therapy: Secondary | ICD-10-CM | POA: Insufficient documentation

## 2018-03-28 DIAGNOSIS — R51 Headache: Secondary | ICD-10-CM | POA: Insufficient documentation

## 2018-03-28 LAB — CBC WITH DIFFERENTIAL/PLATELET
Abs Immature Granulocytes: 0.01 10*3/uL (ref 0.00–0.07)
Basophils Absolute: 0.1 10*3/uL (ref 0.0–0.1)
Basophils Relative: 1 %
Eosinophils Absolute: 0.1 10*3/uL (ref 0.0–0.5)
Eosinophils Relative: 3 %
HCT: 34.7 % — ABNORMAL LOW (ref 36.0–46.0)
Hemoglobin: 10.7 g/dL — ABNORMAL LOW (ref 12.0–15.0)
Immature Granulocytes: 0 %
Lymphocytes Relative: 34 %
Lymphs Abs: 1.6 10*3/uL (ref 0.7–4.0)
MCH: 22.2 pg — ABNORMAL LOW (ref 26.0–34.0)
MCHC: 30.8 g/dL (ref 30.0–36.0)
MCV: 72 fL — ABNORMAL LOW (ref 80.0–100.0)
Monocytes Absolute: 0.3 10*3/uL (ref 0.1–1.0)
Monocytes Relative: 6 %
Neutro Abs: 2.7 10*3/uL (ref 1.7–7.7)
Neutrophils Relative %: 56 %
Platelets: 392 10*3/uL (ref 150–400)
RBC: 4.82 MIL/uL (ref 3.87–5.11)
RDW: 24.6 % — ABNORMAL HIGH (ref 11.5–15.5)
WBC: 4.8 10*3/uL (ref 4.0–10.5)
nRBC: 0 % (ref 0.0–0.2)

## 2018-03-28 LAB — FERRITIN: Ferritin: 80 ng/mL (ref 11–307)

## 2018-03-28 LAB — APTT: aPTT: 30 seconds (ref 24–36)

## 2018-03-28 LAB — PLATELET FUNCTION ASSAY: Collagen / Epinephrine: 144 seconds (ref 0–193)

## 2018-03-28 LAB — PROTIME-INR
INR: 0.9 (ref 0.8–1.2)
Prothrombin Time: 12.1 seconds (ref 11.4–15.2)

## 2018-03-28 MED ORDER — IRON-VITAMIN C 65-125 MG PO TABS: 1.0000 | ORAL_TABLET | Freq: Two times a day (BID) | ORAL | 1 refills | Status: DC

## 2018-03-28 NOTE — Progress Notes (Signed)
[facility] day:  03/28/2018  Chief Complaint: Caitlyn Vincent is a 38 y.o. female with anemia who is referred by Dr Cephas Darby for assessment and management.  HPI: The patient denies any knowledge of anemia before recent events.  Review of labs note a history of anemia dating back to 03/02/2013.  Hemogloblin has ranged from 7.6 - 10.1.  MCV has ranged from 63.7 - 80.  She has never had a transfusion.  The patient was seen in the [facility] ER on 03/08/2018 for headache and dizziness x 2 weeks.  Head CT was negative.  CBC revealed a hematocrit of 29.7, hemoglobin 8.8, MCV 67.7, platelets 464,000, and WBC 7700.  Creatinine was 0.49.  LFTS were normal.  Ferritin was 3 with an iron saturation of 4% and a TIBC of 411.  She was prescribed oral iron and meclizine.  She states that she started oral iron on 03/09/2018.  She is tolerating it well except for constipation.  Symptomatically, she notes headaches at night.  Appetite has been decreased.  Regarding her diet, she eats meat daily.  She does not eat vegetables.  She has had ice pica for "a long time".  She denies restless legs.    She notes a long standing history of heavy menses.  Menses last 5-6 days.  She uses a pad and tampon at the same time and change both every 3 hours.  She gets dizzy sometimes with her menses.  She completed her last menses on 03/26/2018.  She had a cholecystectomy and 2 children without excess bleeding.  She bruises easily with small "bumps".  She takes aspirin and ibuprofen rarely (last prior to ER visit).  She denies any family history of any blood disorder.  She denies any family history of colon cancer.   Past Medical History:  Diagnosis Date  . Diabetes mellitus without complication [facility])     Past Surgical History:  Procedure Laterality Date  . CHOLECYSTECTOMY      History reviewed. No pertinent family history.  Social History:  reports that she has never  smoked. She has never used smokeless tobacco. She reports current alcohol use. She reports that she does not use drugs.  She is from [place].  She speaks [place].  She has been in [place] x 7 years.  She lives in [place].  She has 2 children (son and a daughter).  She works at a [employer].  The patient is accompanied by by her best friend, husband, and tele-interpreter, Caitlyn Vincent [phone], today.  Allergies: No Known Allergies  Current Medications: Current Outpatient Medications  Medication Sig Dispense Refill  . butalbital-acetaminophen-caffeine (FIORICET, ESGIC) 50-325-40 MG tablet Take 1-2 tablets by mouth every 6 (six) hours as needed. 20 tablet 0  . ferrous sulfate (EQL SLOW RELEASE IRON) 160 (50 Fe) MG TBCR SR tablet Take 1 tablet (160 mg total) by mouth daily. 30 each 6  . Insulin Glargine (LANTUS SOLOSTAR) 100 UNIT/ML Solostar Pen Inject 10 Units into the skin Nightly.    . insulin regular (NOVOLIN R,HUMULIN R) 100 units/mL injection Inject 25 Units into the skin 3 (three) times daily before meals.    . Iron-Vitamin C (VITRON-C) 65-125 MG TABS Take 1 tablet by mouth 2 (two) times daily. 60 tablet 1  . meclizine (ANTIVERT) 25 MG tablet Take 1 tablet (25 mg total) by mouth 3 (three) times daily as needed for dizziness or nausea. (Patient not taking: Reported on 03/28/2018)  30 tablet 1   No current facility-administered medications for this visit.     Review of Systems:  GENERAL:  Feels "ok".  No fevers, sweats.  Weight loss of 5 pounds in past 3 weeks. PERFORMANCE STATUS (ECOG):  1 HEENT:  Change in vision (Rx for glasses 1 year ago).  No runny nose, sore throat, mouth sores or tenderness. Lungs: No shortness of breath or cough.  No hemoptysis. Cardiac:  No chest pain, palpitations, orthopnea, or PND. GI:  Dark stool on oral iron.  No nausea, vomiting, diarrhea, constipation, melena or hematochezia. GU:  Heavy menses.  No urgency, frequency, dysuria, or  hematuria. Musculoskeletal:  No back pain.  No joint pain.  No muscle tenderness. Extremities:  No pain or swelling. Skin:  No rashes or skin changes. Neuro:  Headache at night.  Dizzy with menses.  No numbness or weakness, balance or coordination issues. Endocrine:  Diabetes.  No thyroid issues, hot flashes or night sweats. Psych:  No mood changes, depression or anxiety. Pain:  No focal pain. Review of systems:  All other systems reviewed and found to be negative.  Physical Exam: Blood pressure 106/68, pulse 67, temperature (!) 97.4 F (36.3 C), temperature source Tympanic, resp. rate 18, height 5\' 2"  (1.575 m), weight 139 lb 14.1 oz (63.5 kg). GENERAL:  Well developed, well nourished, woman sitting comfortably in the exam room in no acute distress. MENTAL STATUS:  Alert and oriented to person, place and time. HEAD:  Long dark auburn colored hair.  Normocephalic, atraumatic, face symmetric, no Cushingoid features. EYES:  Brown eyes.  Pupils equal round and reactive to light and accomodation.  No conjunctivitis or scleral icterus. ENT:  Oropharynx clear without lesion.  Tongue normal. Mucous membranes moist.  RESPIRATORY:  Clear to auscultation without rales, wheezes or rhonchi. CARDIOVASCULAR:  Regular rate and rhythm without murmur, rub or gallop. ABDOMEN:  Soft, non-tender, with active bowel sounds, and no hepatosplenomegaly.  No masses. SKIN:  No rashes, ulcers or lesions. EXTREMITIES: No edema, no skin discoloration or tenderness.  No palpable cords. LYMPH NODES: No palpable cervical, supraclavicular, axillary or inguinal adenopathy  NEUROLOGICAL: Unremarkable. PSYCH:  Appropriate.   Office Visit on 03/28/2018  Component Date Value Ref Range Status  . WBC 03/28/2018 4.8  4.0 - 10.5 K/uL Final  . RBC 03/28/2018 4.82  3.87 - 5.11 MIL/uL Final  . Hemoglobin 03/28/2018 10.7* 12.0 - 15.0 g/dL Final  . HCT 03/28/2018 34.7* 36.0 - 46.0 % Final  . MCV 03/28/2018 72.0* 80.0 - 100.0 fL  Final  . MCH 03/28/2018 22.2* 26.0 - 34.0 pg Final  . MCHC 03/28/2018 30.8  30.0 - 36.0 g/dL Final  . RDW 03/28/2018 24.6* 11.5 - 15.5 % Final  . Platelets 03/28/2018 392  150 - 400 K/uL Final  . nRBC 03/28/2018 0.0  0.0 - 0.2 % Final  . Neutrophils Relative % 03/28/2018 56  % Final  . Neutro Abs 03/28/2018 2.7  1.7 - 7.7 K/uL Final  . Lymphocytes Relative 03/28/2018 34  % Final  . Lymphs Abs 03/28/2018 1.6  0.7 - 4.0 K/uL Final  . Monocytes Relative 03/28/2018 6  % Final  . Monocytes Absolute 03/28/2018 0.3  0.1 - 1.0 K/uL Final  . Eosinophils Relative 03/28/2018 3  % Final  . Eosinophils Absolute 03/28/2018 0.1  0.0 - 0.5 K/uL Final  . Basophils Relative 03/28/2018 1  % Final  . Basophils Absolute 03/28/2018 0.1  0.0 - 0.1 K/uL Final  . Immature Granulocytes  03/28/2018 0  % Final  . Abs Immature Granulocytes 03/28/2018 0.01  0.00 - 0.07 K/uL Final   Performed at [facility], [address] [place], [place] [address]    Assessment:  Caitlyn Vincent is a 38 y.o. female with iron deficiency anemia since 02/2013.  Hemogloblin has ranged from 7.6 - 10.1.  MCV has ranged from 63.7 - 80.  She has never had a transfusion.  She describes a long standing history of menorrhagia.  She denies any melena, hematochezia, or hematuria.  She eats meat daily.  She has had pica x years.   CBC on 03/08/2018 revealed a hematocrit of 29.7, hemoglobin 8.8, MCV 67.7, platelets 464,000, and WBC 7700. Ferritin was 3 with an iron saturation of 4% and a TIBC of 411.  Symptomatically, she becomes lightheaded with menses.  Anemia is associated with headaches.  Exam is unremarkable.  Plan: 1.   Labs today:  CBC with diff, ferritin, PT, PTT, von Willebrand panel, platelet function assay. 2.   Iron deficiency anemia  Laboratory history reviewed.  Etiology of anemia likely secondary to heavy menses.  Discuss importance of iron rich diet and taking oral iron.  Oral iron causes constipation.  Patient  concerned about taking oral iron with OJ secondary to her diabetes.  Discuss trial of Vitron C (replaces ferrous sulfate).  Rx:  Vitron C 1 tablet po BID.  If unable to replete iron stores and improve anemia, discuss IV iron.  Potential side effects reviewed.  Preauth Venofer. 3.   Menorrhagia  Patient has long standing history of heavy menses.  Discuss need for GYN evaluation.   Discuss work-up for bleeding diathesis given heavy menses and easy bruisability. 4.   RTC in 1 month for MD assessment, review of work-up, and labs (CBC with diff, ferritin) +/- Venofer.   A total of (42) minutes of face-to-face time was spent with the patient with greater than 50% of that time in counseling and care-coordination.    Lequita Asal, MD  03/28/2018, 1:29 PM

## 2018-03-29 LAB — VON WILLEBRAND PANEL
Coagulation Factor VIII: 136 % (ref 56–140)
Ristocetin Co-factor, Plasma: 107 % (ref 50–200)
Von Willebrand Antigen, Plasma: 116 % (ref 50–200)

## 2018-03-29 LAB — COAG STUDIES INTERP REPORT

## 2018-04-26 ENCOUNTER — Telehealth: Payer: Self-pay | Admitting: Hematology and Oncology

## 2018-04-26 ENCOUNTER — Inpatient Hospital Stay: Payer: Self-pay | Attending: Hematology and Oncology

## 2018-04-26 DIAGNOSIS — K5900 Constipation, unspecified: Secondary | ICD-10-CM | POA: Insufficient documentation

## 2018-04-26 DIAGNOSIS — Z79899 Other long term (current) drug therapy: Secondary | ICD-10-CM | POA: Insufficient documentation

## 2018-04-26 DIAGNOSIS — D509 Iron deficiency anemia, unspecified: Secondary | ICD-10-CM | POA: Insufficient documentation

## 2018-04-26 DIAGNOSIS — R42 Dizziness and giddiness: Secondary | ICD-10-CM | POA: Insufficient documentation

## 2018-04-26 DIAGNOSIS — Z794 Long term (current) use of insulin: Secondary | ICD-10-CM | POA: Insufficient documentation

## 2018-04-26 DIAGNOSIS — E119 Type 2 diabetes mellitus without complications: Secondary | ICD-10-CM | POA: Insufficient documentation

## 2018-04-26 DIAGNOSIS — N920 Excessive and frequent menstruation with regular cycle: Secondary | ICD-10-CM | POA: Insufficient documentation

## 2018-04-26 DIAGNOSIS — R5383 Other fatigue: Secondary | ICD-10-CM | POA: Insufficient documentation

## 2018-04-27 ENCOUNTER — Ambulatory Visit: Payer: Self-pay

## 2018-04-27 ENCOUNTER — Inpatient Hospital Stay: Payer: Self-pay

## 2018-04-27 ENCOUNTER — Other Ambulatory Visit: Payer: Self-pay | Admitting: Hematology and Oncology

## 2018-04-27 ENCOUNTER — Other Ambulatory Visit: Payer: Self-pay

## 2018-04-27 ENCOUNTER — Inpatient Hospital Stay: Payer: Self-pay | Admitting: Hematology and Oncology

## 2018-04-27 DIAGNOSIS — N920 Excessive and frequent menstruation with regular cycle: Secondary | ICD-10-CM

## 2018-04-27 DIAGNOSIS — D509 Iron deficiency anemia, unspecified: Secondary | ICD-10-CM

## 2018-04-27 LAB — CBC WITH DIFFERENTIAL/PLATELET
Abs Immature Granulocytes: 0.02 10*3/uL (ref 0.00–0.07)
Basophils Absolute: 0.1 10*3/uL (ref 0.0–0.1)
Basophils Relative: 1 %
Eosinophils Absolute: 0.2 10*3/uL (ref 0.0–0.5)
Eosinophils Relative: 3 %
HCT: 35.2 % — ABNORMAL LOW (ref 36.0–46.0)
Hemoglobin: 11.6 g/dL — ABNORMAL LOW (ref 12.0–15.0)
Immature Granulocytes: 0 %
Lymphocytes Relative: 33 %
Lymphs Abs: 2 10*3/uL (ref 0.7–4.0)
MCH: 24.8 pg — ABNORMAL LOW (ref 26.0–34.0)
MCHC: 33 g/dL (ref 30.0–36.0)
MCV: 75.4 fL — ABNORMAL LOW (ref 80.0–100.0)
Monocytes Absolute: 0.3 10*3/uL (ref 0.1–1.0)
Monocytes Relative: 6 %
Neutro Abs: 3.5 10*3/uL (ref 1.7–7.7)
Neutrophils Relative %: 57 %
Platelets: 355 10*3/uL (ref 150–400)
RBC: 4.67 MIL/uL (ref 3.87–5.11)
WBC: 6.1 10*3/uL (ref 4.0–10.5)
nRBC: 0 % (ref 0.0–0.2)

## 2018-04-27 LAB — IRON AND TIBC
Iron: 36 ug/dL (ref 28–170)
Saturation Ratios: 11 % (ref 10.4–31.8)
TIBC: 321 ug/dL (ref 250–450)
UIBC: 285 ug/dL

## 2018-04-27 LAB — FERRITIN: Ferritin: 21 ng/mL (ref 11–307)

## 2018-04-27 NOTE — Progress Notes (Deleted)
[facility]  [address] [place], [place] [address] Phone: [phone]  Fax: [phone]   Telephone Office Visit:  04/27/2018  I connected with [name] on 04/27/18 at 5:22 AM EDT by telephone and verified that I was speaking with the correct person using 2 identifiers.  The patient was at *** home.  I discussed the limitations, risk, security and privacy concerns of performing an evaluation and management service by telephone and  the availability of in person appointments.  I also discussed with the patient that there may be a patient responsible charge related to this service.  The patient expressed understanding and agreed to proceed.    Chief Complaint: Caitlyn Vincent is a 38 y.o. female with anemia who is seen for review of work-up and discussion regarding direction of therapy.  HPI: The patient was last seen in the hematology clinic on 03/28/2018 for new patient appointment.   She had iron deficiency anemia since 02/2013.  Hemogloblin had ranged from 7.6 - 10.1.  MCV had ranged from 63.7 - 80.  She had never had a transfusion.  She described a long standing history of menorrhagia. She became lightheaded with menses.  Anemia was associated with headaches.  Exam was unremarkable.  She was switched to Vitron C.  Work-up revealed a hematocrit of 34.7, hemoglobin 10.7, MCV 72, platelets 392,000, white count 4800 with an ANC of 2700.  Ferritin was 80.  PT was 12.1 with an INR of 0.9.  PTT was 30.  Willebrand panel was normal with factor VIII 136%, ristocetin cofactor of 107% and von Willebrand antigen of 116%.  Platelet function assay was normal.    During the interim,   Past Medical History:  Diagnosis Date  . Diabetes mellitus without complication [facility])     Past Surgical History:  Procedure Laterality Date  . CHOLECYSTECTOMY      No family history on file.  Social History:  reports that she has never smoked. She has never used  smokeless tobacco. She reports current alcohol use. She reports that she does not use drugs.  She is from [place].  She speaks [place].  She has been in [place] x 7 years.  She lives in [place].  She has 2 children (son and a daughter).  She works at a [employer].  The patient is accompanied by by her best friend, husband, and tele-interpreter, Alleen Borne [phone], today.  Participants in the patient's visit included the patient, *** , Waymon Budge, RN or [employer], CMA, today.  The intake visit was provided by Waymon Budge, RN or Vito Berger, [place].   Allergies: No Known Allergies  Current Medications: Current Outpatient Medications  Medication Sig Dispense Refill  . butalbital-acetaminophen-caffeine (FIORICET, ESGIC) 50-325-40 MG tablet Take 1-2 tablets by mouth every 6 (six) hours as needed. 20 tablet 0  . ferrous sulfate (EQL SLOW RELEASE IRON) 160 (50 Fe) MG TBCR SR tablet Take 1 tablet (160 mg total) by mouth daily. 30 each 6  . Insulin Glargine (LANTUS SOLOSTAR) 100 UNIT/ML Solostar Pen Inject 10 Units into the skin Nightly.    . insulin regular (NOVOLIN R,HUMULIN R) 100 units/mL injection Inject 25 Units into the skin 3 (three) times daily before meals.    . Iron-Vitamin C (VITRON-C) 65-125 MG TABS Take 1 tablet by mouth 2 (two) times daily. 60 tablet 1  . meclizine (ANTIVERT) 25 MG tablet Take 1 tablet (25 mg total) by mouth 3 (  three) times daily as needed for dizziness or nausea. (Patient not taking: Reported on 03/28/2018) 30 tablet 1   No current facility-administered medications for this visit.     Review of Systems:  GENERAL:  Feels "ok".  No fevers, sweats.  Weight loss of 5 pounds in past 3 weeks. PERFORMANCE STATUS (ECOG):  1 HEENT:  Change in vision (Rx for glasses 1 year ago).  No runny nose, sore throat, mouth sores or tenderness. Lungs: No shortness of breath or cough.  No hemoptysis. Cardiac:  No chest pain, palpitations,  orthopnea, or PND. GI:  Dark stool on oral iron.  No nausea, vomiting, diarrhea, constipation, melena or hematochezia. GU:  Heavy menses.  No urgency, frequency, dysuria, or hematuria. Musculoskeletal:  No back pain.  No joint pain.  No muscle tenderness. Extremities:  No pain or swelling. Skin:  No rashes or skin changes. Neuro:  Headache at night.  Dizzy with menses.  No numbness or weakness, balance or coordination issues. Endocrine:  Diabetes.  No thyroid issues, hot flashes or night sweats. Psych:  No mood changes, depression or anxiety. Pain:  No focal pain. Review of systems:  All other systems reviewed and found to be negative.  Physical Exam: There were no vitals taken for this visit. GENERAL:  Well developed, well nourished, woman sitting comfortably in the exam room in no acute distress. MENTAL STATUS:  Alert and oriented to person, place and time. HEAD:  Long dark auburn colored hair.  Normocephalic, atraumatic, face symmetric, no Cushingoid features. EYES:  Brown eyes.  Pupils equal round and reactive to light and accomodation.  No conjunctivitis or scleral icterus. ENT:  Oropharynx clear without lesion.  Tongue normal. Mucous membranes moist.  RESPIRATORY:  Clear to auscultation without rales, wheezes or rhonchi. CARDIOVASCULAR:  Regular rate and rhythm without murmur, rub or gallop. ABDOMEN:  Soft, non-tender, with active bowel sounds, and no hepatosplenomegaly.  No masses. SKIN:  No rashes, ulcers or lesions. EXTREMITIES: No edema, no skin discoloration or tenderness.  No palpable cords. LYMPH NODES: No palpable cervical, supraclavicular, axillary or inguinal adenopathy  NEUROLOGICAL: Unremarkable. PSYCH:  Appropriate.   No visits with results within 3 Day(s) from this visit.  Latest known visit with results is:  Office Visit on 03/28/2018  Component Date Value Ref Range Status  . PFA Interpretation 03/28/2018          Final   Comment: Platelet function is normal.  If patient history/physical examination give strong indication of a bleeding disorder repeat testing for confirmation.        Results of the test should always be interpreted in conjunction with the patient's medical history, clinical presentation and medication history. Patients with Hematocrit values <35.0% or Platelet counts <150,000/uL may result in values above the Laboratory established reference range.   . Collagen / Epinephrine 03/28/2018 144  0 - 193 seconds Final   Performed at [facility], [address], [place], [place] [address]  . aPTT 03/28/2018 30  24 - 36 seconds Final   Performed at [facility], [address] [place], [place] [address]  . Prothrombin Time 03/28/2018 12.1  11.4 - 15.2 seconds Final  . INR 03/28/2018 0.9  0.8 - 1.2 Final   Comment: (NOTE) INR goal varies based on device and disease states. Performed at [facility], [address] [place],  [address]   . Coagulation Factor VIII 03/28/2018 136  56 - 140 % Final  . Ristocetin Co-factor, Plasma 03/28/2018 107  50 - 200 % Final   Comment: (NOTE) Performed At: [facility] [place], [place] [id number] Rush Farmer MD UV:[id number]   . Von Willebrand Antigen, Plasma 03/28/2018 116  50 - 200 % Final   Comment: (NOTE) This test was developed and its performance characteristics determined by [facility]. It has not been cleared or approved by the Food and Drug Administration.   . Ferritin 03/28/2018 80  11 - 307 ng/mL Final   Performed at [facility], [address] [place], [place] [address]  . WBC 03/28/2018 4.8  4.0 - 10.5 K/uL Final  . RBC 03/28/2018 4.82  3.87 - 5.11 MIL/uL Final  . Hemoglobin 03/28/2018 10.7* 12.0 - 15.0 g/dL Final  . HCT 03/28/2018 34.7* 36.0 - 46.0 % Final  . MCV 03/28/2018 72.0* 80.0 - 100.0 fL Final  . MCH 03/28/2018 22.2* 26.0 - 34.0 pg Final  . MCHC 03/28/2018 30.8  30.0 - 36.0 g/dL Final  . RDW  03/28/2018 24.6* 11.5 - 15.5 % Final  . Platelets 03/28/2018 392  150 - 400 K/uL Final  . nRBC 03/28/2018 0.0  0.0 - 0.2 % Final  . Neutrophils Relative % 03/28/2018 56  % Final  . Neutro Abs 03/28/2018 2.7  1.7 - 7.7 K/uL Final  . Lymphocytes Relative 03/28/2018 34  % Final  . Lymphs Abs 03/28/2018 1.6  0.7 - 4.0 K/uL Final  . Monocytes Relative 03/28/2018 6  % Final  . Monocytes Absolute 03/28/2018 0.3  0.1 - 1.0 K/uL Final  . Eosinophils Relative 03/28/2018 3  % Final  . Eosinophils Absolute 03/28/2018 0.1  0.0 - 0.5 K/uL Final  . Basophils Relative 03/28/2018 1  % Final  . Basophils Absolute 03/28/2018 0.1  0.0 - 0.1 K/uL Final  . Immature Granulocytes 03/28/2018 0  % Final  . Abs Immature Granulocytes 03/28/2018 0.01  0.00 - 0.07 K/uL Final   Performed at [facility], [address], [place], [place] [address]  . Interpretation 03/28/2018 Note   Final   Comment: (NOTE) ------------------------------- COAGULATION: VON WILLEBRAND FACTOR ASSESSMENT CURRENT RESULTS ASSESSMENT The VWF:Ag is normal. The VWF:RCo is normal. The FVIII is normal. VON WILLEBRAND FACTOR ASSESSMENT CURRENT RESULTS INTERPRETATION - These results are not consistent with a diagnosis of VWD according to the current NHLBI guideline. VON WILLEBRAND FACTOR ASSESSMENT - Results may be falsely elevated and possibly falsely normal as VWF and FVIII may increase in pregnancy, in samples drawn from patients (particularly children) who are visibly stressed at the time of phlebotomy, as acute phase reactants, or in response to certain drug therapies such as desmopressin. Repeat testing may be necessary before excluding a diagnosis of VWD especially if the clinical suspicion is high for an underlying bleeding disorder. The setting for phlebotomy should be as calm as possible and patients should be encouraged to sit quietly prior to the blood draw. VON WILLEBRAND FA                          CTOR  ASSESSMENT DEFINITIONS - VWD - von Willebrand disease; VWF - von Willebrand factor; VWF:Ag - VWF antigen; VWF:RCo - VWF ristocetin cofactor activity; FVIII - factor VIII activity. - MEDICAL DIRECTOR: For questions regarding panel interpretation, please contact Jake Bathe, M.D. at [facility] at [phone]. ------------------------------- DISCLAIMER These assessments and interpretations are provided as a convenience in support of the physician-patient relationship and are not intended to replace the physician's clinical judgment.  They are derived from national guidelines in addition to other evidence and expert opinion. The clinician should consider this information within the context of clinical opinion and the individual patient. SEE GUIDANCE FOR VON WILLEBRAND FACTOR ASSESSMENT: (1) The National Heart, Lung and Blood Institute. The Diagnosis, Evaluation and Management of von Willebrand Disease. Janeal Holmes, MD: [facility]                          alth Publication 35-3614. 4315. Available at vSpecials.com.pt. (2) Daryl Eastern et al. Carmin Muskrat J Hematol. 2009; 84(6):366-370. (3) [place]. 2004;10(3):199-217. (4) Pasi KJ et al. Haemophilia. 2004; 10(3):218-231. Performed At: [facility] [place], [place] [id number] Thomasene Ripple MD JK:[id number]     Assessment:  MITZY NARON is a 38 y.o. female with iron deficiency anemia since 02/2013.  Hemogloblin has ranged from 7.6 - 10.1.  MCV has ranged from 63.7 - 80.  She has never had a transfusion.  She describes a long standing history of menorrhagia.  She denies any melena, hematochezia, or hematuria.  She eats meat daily.  She has had pica x years.   CBC on 03/08/2018 revealed a hematocrit of 29.7, hemoglobin 8.8, MCV 67.7, platelets 464,000, and WBC 7700. Ferritin was 3 with an iron saturation of 4% and a TIBC of 411.   Symptomatically,   Plan: 1.   Labs today:  CBC with diff, ferritin, iron studies.   2.   Iron deficiency anemia  Laboratory history reviewed.  Etiology of anemia likely secondary to heavy menses.  Discuss importance of iron rich diet and taking oral iron.  Oral iron causes constipation.  Patient concerned about taking oral iron with OJ secondary to her diabetes.  Discuss trial of Vitron C (replaces ferrous sulfate).  Rx:  Vitron C 1 tablet po BID.  If unable to replete iron stores and improve anemia, discuss IV iron.  Potential side effects reviewed.  Preauth Venofer. 3.   Menorrhagia  Patient has long standing history of heavy menses.  Discuss need for GYN evaluation.   Discuss work-up for bleeding diathesis given heavy menses and easy bruisability. 4.   RTC in 1 month for MD assessment, review of work-up, and labs (CBC with diff, ferritin) +/- Venofer.   I discussed the assessment and treatment plan with the patient.  The patient was provided an opportunity to ask questions and all were answered.  The patient agreed with the plan and demonstrated an understanding of the instructions.  The patient was advised to call back or seek an in person evaluation if the symptoms worsen or if the condition fails to improve as anticipated.  I provided *** minutes (5:22 AM - **:**) of non-face-to-face time during this encounter.  I provided these services from the [facility] office.   Lequita Asal, MD, PhD  04/27/2018, 5:22 AM

## 2018-05-02 ENCOUNTER — Ambulatory Visit: Payer: Self-pay | Admitting: Hematology and Oncology

## 2018-05-02 ENCOUNTER — Ambulatory Visit: Payer: Self-pay

## 2018-05-03 ENCOUNTER — Telehealth: Payer: Self-pay | Admitting: Hematology and Oncology

## 2018-05-04 ENCOUNTER — Other Ambulatory Visit: Payer: Self-pay

## 2018-05-04 ENCOUNTER — Encounter: Payer: Self-pay | Admitting: Hematology and Oncology

## 2018-05-04 ENCOUNTER — Inpatient Hospital Stay (HOSPITAL_BASED_OUTPATIENT_CLINIC_OR_DEPARTMENT_OTHER): Payer: Self-pay | Admitting: Hematology and Oncology

## 2018-05-04 ENCOUNTER — Inpatient Hospital Stay: Payer: Self-pay | Admitting: Hematology and Oncology

## 2018-05-04 VITALS — BP 121/71 | HR 69 | Temp 97.9°F | Resp 18 | Ht 62.0 in | Wt 139.8 lb

## 2018-05-04 DIAGNOSIS — K5900 Constipation, unspecified: Secondary | ICD-10-CM

## 2018-05-04 DIAGNOSIS — N920 Excessive and frequent menstruation with regular cycle: Secondary | ICD-10-CM

## 2018-05-04 DIAGNOSIS — R42 Dizziness and giddiness: Secondary | ICD-10-CM

## 2018-05-04 DIAGNOSIS — Z794 Long term (current) use of insulin: Secondary | ICD-10-CM

## 2018-05-04 DIAGNOSIS — D509 Iron deficiency anemia, unspecified: Secondary | ICD-10-CM

## 2018-05-04 DIAGNOSIS — R5383 Other fatigue: Secondary | ICD-10-CM

## 2018-05-04 DIAGNOSIS — Z79899 Other long term (current) drug therapy: Secondary | ICD-10-CM

## 2018-05-04 DIAGNOSIS — E119 Type 2 diabetes mellitus without complications: Secondary | ICD-10-CM

## 2018-05-04 NOTE — Progress Notes (Signed)
[facility]  [address] [place], [place] [address] Phone: [phone]  Fax: [phone]   Office Visit:  05/04/2018  Referring physician: [facility]  Chief Complaint: Caitlyn Vincent is a 38 y.o. female with anemia who is seen for review of work-up and discussion regarding direction of therapy.  HPI: The patient was last seen in the hematology clinic on 03/28/2018 for new patient appointment.   She had iron deficiency anemia since 02/2013.  Hemogloblin had ranged from 7.6 - 10.1.  MCV had ranged from 63.7 - 80.  She had never had a transfusion.  She described a long standing history of menorrhagia. She became lightheaded with menses.  Anemia was associated with headaches.  Exam was unremarkable.  She was switched to Vitron C.  Work-up revealed a hematocrit of 34.7, hemoglobin 10.7, MCV 72, platelets 392,000, white count 4800 with an ANC of 2700.  Ferritin was 80.  PT was 12.1 with an INR of 0.9.  PTT was 30.  Willebrand panel was normal with factor VIII 136%, ristocetin cofactor of 107% and von Willebrand antigen of 116%.  Platelet function assay was normal.    During the interim, she has felt "a little tired".  She is tolerating her oral iron with vitamin C well.  She notes a little constipation.  She has some body aches.  She continues to have headaches.     Past Medical History:  Diagnosis Date  . Diabetes mellitus without complication [facility])     Past Surgical History:  Procedure Laterality Date  . CHOLECYSTECTOMY      History reviewed. No pertinent family history.  Social History:  reports that she has never smoked. She has never used smokeless tobacco. She reports current alcohol use. She reports that she does not use drugs.  She is from [place].  She speaks [place].  She has been in [place] x 7 years.  She lives in [place].  She has 2 children (son and a daughter).  She works at a [employer].   The patient is accompanied by the tele-interpreter, Caitlyn Vincent [phone], today.  Allergies: No Known Allergies  Current Medications: Current Outpatient Medications  Medication Sig Dispense Refill  . butalbital-acetaminophen-caffeine (FIORICET, ESGIC) 50-325-40 MG tablet Take 1-2 tablets by mouth every 6 (six) hours as needed. 20 tablet 0  . ferrous sulfate (EQL SLOW RELEASE IRON) 160 (50 Fe) MG TBCR SR tablet Take 1 tablet (160 mg total) by mouth daily. 30 each 6  . Insulin Glargine (LANTUS SOLOSTAR) 100 UNIT/ML Solostar Pen Inject 10 Units into the skin Nightly.    . insulin regular (NOVOLIN R,HUMULIN R) 100 units/mL injection Inject 25 Units into the skin 3 (three) times daily before meals.    . Iron-Vitamin C (VITRON-C) 65-125 MG TABS Take 1 tablet by mouth 2 (two) times daily. 60 tablet 1  . meclizine (ANTIVERT) 25 MG tablet Take 1 tablet (25 mg total) by mouth 3 (three) times daily as needed for dizziness or nausea. (Patient not taking: Reported on 03/28/2018) 30 tablet 1   No current facility-administered medications for this visit.     Review of Systems:  GENERAL:  Feels "a little tired".  No fevers, sweats or weight loss.  Weight stable. PERFORMANCE STATUS (ECOG):  1 HEENT:  No visual changes, runny nose, sore throat, mouth sores or tenderness. Lungs: No shortness of breath or cough.  No hemoptysis. Cardiac:  No chest pain, palpitations, orthopnea, or PND. GI:  No nausea, vomiting, diarrhea, constipation, melena or hematochezia. GU:  Menses remains heavy.  No urgency, frequency, dysuria, or hematuria. Musculoskeletal:  No back pain.  No joint pain.  No muscle tenderness. Extremities:  No pain or swelling. Skin:  No rashes or skin changes. Neuro:  Headache (back of head at night).  No numbness or weakness, balance or coordination issues. Endocrine:  Diabetes.  No thyroid issues, hot flashes or night sweats. Psych:  No mood changes, depression or anxiety. Pain:  No focal pain. Review  of systems:  All other systems reviewed and found to be negative.   Physical Exam: Blood pressure 121/71, pulse 69, temperature 97.9 F (36.6 C), temperature source Tympanic, resp. rate 18, height 5\' 2"  (1.575 m), weight 139 lb 12.4 oz (63.4 kg), SpO2 100 %. GENERAL:  Well developed, well nourished, woman sitting comfortably in the exam room in no acute distress. MENTAL STATUS:  Alert and oriented to person, place and time. HEAD:  Auburn hair.  Normocephalic, atraumatic, face symmetric, no Cushingoid features. EYES:  Glasses.  Brown eyes.  Pupils equal round and reactive to light and accomodation.  No conjunctivitis or scleral icterus. ENT:  Wearing a mask.  Oropharynx clear without lesion.  Tongue normal. Mucous membranes moist.  RESPIRATORY:  Clear to auscultation without rales, wheezes or rhonchi. CARDIOVASCULAR:  Regular rate and rhythm without murmur, rub or gallop. ABDOMEN:  Soft, non-tender, with active bowel sounds, and no hepatosplenomegaly.  No masses. SKIN:  No rashes, ulcers or lesions. EXTREMITIES: No edema, no skin discoloration or tenderness.  No palpable cords. LYMPH NODES: No palpable cervical, supraclavicular, axillary or inguinal adenopathy  NEUROLOGICAL: Unremarkable. PSYCH:  Appropriate.    No visits with results within 3 Day(s) from this visit.  Latest known visit with results is:  Appointment on 04/27/2018  Component Date Value Ref Range Status  . Iron 04/27/2018 36  28 - 170 ug/dL Final  . TIBC 04/27/2018 321  250 - 450 ug/dL Final  . Saturation Ratios 04/27/2018 11  10.4 - 31.8 % Final  . UIBC 04/27/2018 285  ug/dL Final   Performed at [facility], [address] [place], [place] [address]  . Ferritin 04/27/2018 21  11 - 307 ng/mL Final   Performed at [facility], [address] [place], [place] [address]  . WBC 04/27/2018 6.1  4.0 - 10.5 K/uL Final  . RBC 04/27/2018 4.67  3.87 - 5.11 MIL/uL Final  . Hemoglobin 04/27/2018 11.6* 12.0 -  15.0 g/dL Final  . HCT 04/27/2018 35.2* 36.0 - 46.0 % Final  . MCV 04/27/2018 75.4* 80.0 - 100.0 fL Final  . MCH 04/27/2018 24.8* 26.0 - 34.0 pg Final  . MCHC 04/27/2018 33.0  30.0 - 36.0 g/dL Final  . RDW 04/27/2018 Not Measured  11.5 - 15.5 % Final  . Platelets 04/27/2018 355  150 - 400 K/uL Final  . nRBC 04/27/2018 0.0  0.0 - 0.2 % Final  . Neutrophils Relative % 04/27/2018 57  % Final  . Neutro Abs 04/27/2018 3.5  1.7 - 7.7 K/uL Final  . Lymphocytes Relative 04/27/2018 33  % Final  . Lymphs Abs 04/27/2018 2.0  0.7 - 4.0 K/uL Final  . Monocytes Relative 04/27/2018 6  % Final  . Monocytes Absolute 04/27/2018 0.3  0.1 - 1.0 K/uL Final  . Eosinophils Relative 04/27/2018 3  % Final  . Eosinophils Absolute 04/27/2018 0.2  0.0 - 0.5 K/uL Final  . Basophils Relative 04/27/2018 1  % Final  .  Basophils Absolute 04/27/2018 0.1  0.0 - 0.1 K/uL Final  . Immature Granulocytes 04/27/2018 0  % Final  . Abs Immature Granulocytes 04/27/2018 0.02  0.00 - 0.07 K/uL Final   Performed at [facility], [address] [place], [place] [address]    Assessment:  Caitlyn Vincent is a 38 y.o. female with iron deficiency anemia since 02/2013.  Hemogloblin has ranged from 7.6 - 10.1.  MCV has ranged from 63.7 - 80.  She has never had a transfusion.  She was switched to Vitron C on 03/28/2018.  She has a long standing history of menorrhagia.  She denies any melena, hematochezia, or hematuria.  Die is good.  She has had pica x years.   Work-up on 03/28/2018 revealed a hematocrit of 34.7, hemoglobin 10.7, MCV 72, platelets 392,000, white count 4800 with an ANC of 2700.  Ferritin was 80.  Coagulation work-up revealed a normal:  PT, PTT, platelet function assay, and von Willebrand panel.    Ferritin has been followed:  3 on 03/08/2018, 80 on 03/28/2018, and 21 on 04/27/2018.  Symptomatically, she remains a little fatigued.  Hemoglobin is 11.6.  Ferritin is 21.  Plan: 1.   Review work-up and labs  from 04/27/2018. 2.   Iron deficiency anemia  Hemoglobin has improved from 8.8 (03/08/2018) to 10.7 (03/102020) to 11.6 (04/27/2018).  MCV has improved from 67.7 (03/08/2018) to 75.4 (04/27/2018).  Etiology secondary to heavy menses.  Patient on Vitron C 1 tablet BID.  Ferritin remains low (21).  Ferritin goal 100.  Suspect iron stores will improve now that patient no longer anemic. 3.   Menorrhagia  Patient has long standing history of heavy menses.  Work-up for bleeding diathesis was negative.  Encourage follow-up with GYN.  4.   Fatigue and headache  Etiology unclear.  Anemia improving and thus not likely related.  Check TSH at next visit.  Patient to follow-up with with PCP. 5.   RTC in 2 months for labs (CBC, ferritin, TSH). 6.   RTC in 4 months for MD assessment and labs (CBC with diff, ferritin).    I discussed the assessment and treatment plan with the patient.  The patient was provided an opportunity to ask questions and all were answered.  The patient agreed with the plan and demonstrated an understanding of the instructions.  The patient was advised to call back or seek an in person evaluation if the symptoms worsen or if the condition fails to improve as anticipated.  I provided 15 minutes of face-to-face visit time during this this encounter and > 50% was spent counseling as documented under my assessment and plan.   Lequita Asal, MD, PhD  05/04/2018, 4:09 PM

## 2018-05-04 NOTE — Progress Notes (Signed)
The patient c/o headache pain 9/10 in the back of her head Pain level in bilateral legs (7).

## 2018-05-04 NOTE — Progress Notes (Deleted)
[facility]  [address] [place], [place] [address] Phone: [phone]  Fax: [phone]   Telemedicine Office Visit:  05/04/2018  Referring physician: [place] connected with CYNCERE RUHE on 05/04/18 at 3:23 PM EDT by videoconferencing and verified that I was speaking with the correct person using 2 identifiers.  The patient was at *** home.  I discussed the limitations, risk, security and privacy concerns of performing an evaluation and management service by videoconferencing and  the availability of in person appointments.  I also discussed with the patient that there may be a patient responsible charge related to this service.  The patient expressed understanding and agreed to proceed.    Chief Complaint: Caitlyn Vincent is a 38 y.o. female with anemia who is seen for review of work-up and discussion regarding direction of therapy.  HPI: The patient was last seen in the hematology clinic on 03/28/2018 for new patient appointment.   She had iron deficiency anemia since 02/2013.  Hemogloblin had ranged from 7.6 - 10.1.  MCV had ranged from 63.7 - 80.  She had never had a transfusion.  She described a long standing history of menorrhagia. She became lightheaded with menses.  Anemia was associated with headaches.  Exam was unremarkable.  She was switched to Vitron C.  Work-up revealed a hematocrit of 34.7, hemoglobin 10.7, MCV 72, platelets 392,000, white count 4800 with an ANC of 2700.  Ferritin was 80.  PT was 12.1 with an INR of 0.9.  PTT was 30.  Willebrand panel was normal with factor VIII 136%, ristocetin cofactor of 107% and von Willebrand antigen of 116%.  Platelet function assay was normal.    During the interim,   Past Medical History:  Diagnosis Date  . Diabetes mellitus without complication [facility])     Past Surgical History:  Procedure Laterality Date  . CHOLECYSTECTOMY      No family history on file.  Social  History:  reports that she has never smoked. She has never used smokeless tobacco. She reports current alcohol use. She reports that she does not use drugs.  She is from [place].  She speaks [place].  She has been in [place] x 7 years.  She lives in [place].  She has 2 children (son and a daughter).  She works at a [employer].  The patient is accompanied by by her best friend, husband, and tele-interpreter, Alleen Borne [phone], today.  Participants in the patient's visit included the patient, *** , Waymon Budge, RN or [employer], CMA, today.  The intake visit was provided by Waymon Budge, RN or Vito Berger, [place].   Allergies: No Known Allergies  Current Medications: Current Outpatient Medications  Medication Sig Dispense Refill  . butalbital-acetaminophen-caffeine (FIORICET, ESGIC) 50-325-40 MG tablet Take 1-2 tablets by mouth every 6 (six) hours as needed. 20 tablet 0  . ferrous sulfate (EQL SLOW RELEASE IRON) 160 (50 Fe) MG TBCR SR tablet Take 1 tablet (160 mg total) by mouth daily. 30 each 6  . Insulin Glargine (LANTUS SOLOSTAR) 100 UNIT/ML Solostar Pen Inject 10 Units into the skin Nightly.    . insulin regular (NOVOLIN R,HUMULIN R) 100 units/mL injection Inject 25 Units into the skin 3 (three) times daily before meals.    . Iron-Vitamin C (VITRON-C) 65-125 MG TABS Take 1 tablet by mouth 2 (two) times daily. 60 tablet 1  . meclizine (ANTIVERT) 25 MG tablet Take 1 tablet (25  mg total) by mouth 3 (three) times daily as needed for dizziness or nausea. (Patient not taking: Reported on 03/28/2018) 30 tablet 1   No current facility-administered medications for this visit.     Review of Systems:  GENERAL:  Feels "ok".  No fevers, sweats.  Weight loss of 5 pounds in past 3 weeks. PERFORMANCE STATUS (ECOG):  1 HEENT:  Change in vision (Rx for glasses 1 year ago).  No runny nose, sore throat, mouth sores or tenderness. Lungs: No shortness of breath or  cough.  No hemoptysis. Cardiac:  No chest pain, palpitations, orthopnea, or PND. GI:  Dark stool on oral iron.  No nausea, vomiting, diarrhea, constipation, melena or hematochezia. GU:  Heavy menses.  No urgency, frequency, dysuria, or hematuria. Musculoskeletal:  No back pain.  No joint pain.  No muscle tenderness. Extremities:  No pain or swelling. Skin:  No rashes or skin changes. Neuro:  Headache at night.  Dizzy with menses.  No numbness or weakness, balance or coordination issues. Endocrine:  Diabetes.  No thyroid issues, hot flashes or night sweats. Psych:  No mood changes, depression or anxiety. Pain:  No focal pain. Review of systems:  All other systems reviewed and found to be negative.  Physical Exam: There were no vitals taken for this visit. GENERAL:  Well developed, well nourished, woman sitting comfortably in the exam room in no acute distress. MENTAL STATUS:  Alert and oriented to person, place and time. HEAD:  Long dark auburn colored hair.  Normocephalic, atraumatic, face symmetric, no Cushingoid features. EYES:  Brown eyes.  Pupils equal round and reactive to light and accomodation.  No conjunctivitis or scleral icterus. ENT:  Oropharynx clear without lesion.  Tongue normal. Mucous membranes moist.  RESPIRATORY:  Clear to auscultation without rales, wheezes or rhonchi. CARDIOVASCULAR:  Regular rate and rhythm without murmur, rub or gallop. ABDOMEN:  Soft, non-tender, with active bowel sounds, and no hepatosplenomegaly.  No masses. SKIN:  No rashes, ulcers or lesions. EXTREMITIES: No edema, no skin discoloration or tenderness.  No palpable cords. LYMPH NODES: No palpable cervical, supraclavicular, axillary or inguinal adenopathy  NEUROLOGICAL: Unremarkable. PSYCH:  Appropriate.   No visits with results within 3 Day(s) from this visit.  Latest known visit with results is:  Appointment on 04/27/2018  Component Date Value Ref Range Status  . Iron 04/27/2018 36  28 -  170 ug/dL Final  . TIBC 04/27/2018 321  250 - 450 ug/dL Final  . Saturation Ratios 04/27/2018 11  10.4 - 31.8 % Final  . UIBC 04/27/2018 285  ug/dL Final   Performed at [facility], [address] [place], [place] [address]  . Ferritin 04/27/2018 21  11 - 307 ng/mL Final   Performed at [facility], [address] [place], [place] [address]  . WBC 04/27/2018 6.1  4.0 - 10.5 K/uL Final  . RBC 04/27/2018 4.67  3.87 - 5.11 MIL/uL Final  . Hemoglobin 04/27/2018 11.6* 12.0 - 15.0 g/dL Final  . HCT 04/27/2018 35.2* 36.0 - 46.0 % Final  . MCV 04/27/2018 75.4* 80.0 - 100.0 fL Final  . MCH 04/27/2018 24.8* 26.0 - 34.0 pg Final  . MCHC 04/27/2018 33.0  30.0 - 36.0 g/dL Final  . RDW 04/27/2018 Not Measured  11.5 - 15.5 % Final  . Platelets 04/27/2018 355  150 - 400 K/uL Final  . nRBC 04/27/2018 0.0  0.0 - 0.2 % Final  . Neutrophils Relative % 04/27/2018 57  % Final  .  Neutro Abs 04/27/2018 3.5  1.7 - 7.7 K/uL Final  . Lymphocytes Relative 04/27/2018 33  % Final  . Lymphs Abs 04/27/2018 2.0  0.7 - 4.0 K/uL Final  . Monocytes Relative 04/27/2018 6  % Final  . Monocytes Absolute 04/27/2018 0.3  0.1 - 1.0 K/uL Final  . Eosinophils Relative 04/27/2018 3  % Final  . Eosinophils Absolute 04/27/2018 0.2  0.0 - 0.5 K/uL Final  . Basophils Relative 04/27/2018 1  % Final  . Basophils Absolute 04/27/2018 0.1  0.0 - 0.1 K/uL Final  . Immature Granulocytes 04/27/2018 0  % Final  . Abs Immature Granulocytes 04/27/2018 0.02  0.00 - 0.07 K/uL Final   Performed at [facility], [address] [place], [place] [address]    Assessment:  Caitlyn Vincent is a 38 y.o. female with iron deficiency anemia since 02/2013.  Hemogloblin has ranged from 7.6 - 10.1.  MCV has ranged from 63.7 - 80.  She has never had a transfusion.  She was switched to Vitron C on 03/28/2018.  She has a long standing history of menorrhagia.  She denies any melena, hematochezia, or hematuria.  Die is good.   She has had pica x years.   Work-up on 03/28/2018 revealed a hematocrit of 34.7, hemoglobin 10.7, MCV 72, platelets 392,000, white count 4800 with an ANC of 2700.  Ferritin was 80.  Coagulation work-up revealed a normal:  PT, PTT, platelet function assay, and von Willebrand panel.    Ferritin has been followed:  3 on 03/08/2018, 80 on 03/28/2018, and 21 on 04/27/2018.  Symptomatically,   Plan: 1.   Review work-up and labs from 04/27/2018. 2.   Iron deficiency anemia  Hemoglobin has improved from 8.8 (03/08/2018) to 10.7 (03/102020) to 11.6 (04/27/2018).  Etiology secondary to heavy menses.  Patient on Vitron C 1 BID.  Ferritin remains low (21).  Ferritin goal 100.    Oral iron causes constipation.  Patient concerned about taking oral iron with OJ secondary to her diabetes.  Discuss trial of Vitron C (replaces ferrous sulfate).  Rx:  Vitron C 1 tablet po BID.  If unable to replete iron stores and improve anemia, discuss IV iron.  Potential side effects reviewed.  Preauth Venofer. 3.   Menorrhagia  Patient has long standing history of heavy menses.  Discuss need for GYN evaluation.   Discuss work-up for bleeding diathesis given heavy menses and easy bruisability. 4.   RTC in 1 month for MD assessment, review of work-up, and labs (CBC with diff, ferritin) +/- Venofer.    I discussed the assessment and treatment plan with the patient.  The patient was provided an opportunity to ask questions and all were answered.  The patient agreed with the plan and demonstrated an understanding of the instructions.  The patient was advised to call back or seek an in person evaluation if the symptoms worsen or if the condition fails to improve as anticipated.  I provided *** minutes of face-to-face video visit time during this this encounter and > 50% was spent counseling as documented under my assessment and plan.  I provided these services from the [facility] office***.   Lequita Asal, MD, PhD   05/04/2018, 3:23 PM

## 2018-05-11 ENCOUNTER — Ambulatory Visit: Payer: Self-pay | Admitting: Hematology and Oncology

## 2018-07-05 ENCOUNTER — Inpatient Hospital Stay: Payer: Self-pay | Attending: Hematology and Oncology

## 2018-09-04 ENCOUNTER — Inpatient Hospital Stay: Payer: Self-pay | Admitting: Hematology and Oncology

## 2018-09-04 ENCOUNTER — Inpatient Hospital Stay: Payer: Self-pay | Attending: Hematology and Oncology

## 2018-10-22 ENCOUNTER — Emergency Department: Payer: Self-pay

## 2018-10-22 ENCOUNTER — Emergency Department
Admission: EM | Admit: 2018-10-22 | Discharge: 2018-10-22 | Disposition: A | Discharge status: Home / Self Care | Payer: Self-pay | Attending: Student | Admitting: Student

## 2018-10-22 ENCOUNTER — Other Ambulatory Visit: Payer: Self-pay

## 2018-10-22 DIAGNOSIS — Z20828 Contact with and (suspected) exposure to other viral communicable diseases: Secondary | ICD-10-CM | POA: Insufficient documentation

## 2018-10-22 DIAGNOSIS — Z20822 Contact with and (suspected) exposure to covid-19: Secondary | ICD-10-CM

## 2018-10-22 DIAGNOSIS — E119 Type 2 diabetes mellitus without complications: Secondary | ICD-10-CM | POA: Insufficient documentation

## 2018-10-22 DIAGNOSIS — N3000 Acute cystitis without hematuria: Secondary | ICD-10-CM | POA: Insufficient documentation

## 2018-10-22 DIAGNOSIS — R059 Cough, unspecified: Secondary | ICD-10-CM

## 2018-10-22 DIAGNOSIS — R05 Cough: Secondary | ICD-10-CM

## 2018-10-22 DIAGNOSIS — Z794 Long term (current) use of insulin: Secondary | ICD-10-CM | POA: Insufficient documentation

## 2018-10-22 DIAGNOSIS — Z79899 Other long term (current) drug therapy: Secondary | ICD-10-CM | POA: Insufficient documentation

## 2018-10-22 HISTORY — DX: Anemia, unspecified: D64.9

## 2018-10-22 LAB — COMPREHENSIVE METABOLIC PANEL
ALT: 15 U/L (ref 0–44)
AST: 19 U/L (ref 15–41)
Albumin: 4.3 g/dL (ref 3.5–5.0)
Alkaline Phosphatase: 85 U/L (ref 38–126)
Anion gap: 16 — ABNORMAL HIGH (ref 5–15)
BUN: 19 mg/dL (ref 6–20)
CO2: 21 mmol/L — ABNORMAL LOW (ref 22–32)
Calcium: 10.1 mg/dL (ref 8.9–10.3)
Chloride: 95 mmol/L — ABNORMAL LOW (ref 98–111)
Creatinine, Ser: 0.81 mg/dL (ref 0.44–1.00)
GFR calc Af Amer: >60 mL/min (ref >60)
GFR calc non Af Amer: >60 mL/min (ref >60)
Glucose, Bld: 280 mg/dL — ABNORMAL HIGH (ref 70–99)
Potassium: 3.8 mmol/L (ref 3.5–5.1)
Sodium: 132 mmol/L — ABNORMAL LOW (ref 135–145)
Total Bilirubin: 1 mg/dL (ref 0.3–1.2)
Total Protein: 8.5 g/dL — ABNORMAL HIGH (ref 6.5–8.1)

## 2018-10-22 LAB — URINALYSIS, COMPLETE (UACMP) WITH MICROSCOPIC
Bilirubin Urine: NEGATIVE
Glucose, UA: >=500 mg/dL — AB
Ketones, ur: NEGATIVE mg/dL
Nitrite: POSITIVE — AB
Protein, ur: NEGATIVE mg/dL
Specific Gravity, Urine: 1.012 (ref 1.005–1.030)
WBC, UA: >50 WBC/hpf — ABNORMAL HIGH (ref 0–5)
pH: 5 (ref 5.0–8.0)

## 2018-10-22 LAB — CBC
HCT: 41 % (ref 36.0–46.0)
Hemoglobin: 13.9 g/dL (ref 12.0–15.0)
MCH: 28 pg (ref 26.0–34.0)
MCHC: 33.9 g/dL (ref 30.0–36.0)
MCV: 82.7 fL (ref 80.0–100.0)
Platelets: 358 10*3/uL (ref 150–400)
RBC: 4.96 MIL/uL (ref 3.87–5.11)
RDW: 12.3 % (ref 11.5–15.5)
WBC: 9.7 10*3/uL (ref 4.0–10.5)
nRBC: 0 % (ref 0.0–0.2)

## 2018-10-22 LAB — LIPASE, BLOOD: Lipase: 20 U/L (ref 11–51)

## 2018-10-22 LAB — POCT PREGNANCY, URINE: Preg Test, Ur: NEGATIVE

## 2018-10-22 LAB — GLUCOSE, CAPILLARY: Glucose-Capillary: 284 mg/dL — ABNORMAL HIGH (ref 70–99)

## 2018-10-22 MED ORDER — CEFDINIR 300 MG PO CAPS: 300.0000 mg | ORAL_CAPSULE | Freq: Two times a day (BID) | ORAL | 0 refills | Status: AC

## 2018-10-22 MED ORDER — SODIUM CHLORIDE 0.9 % IV SOLN
1.0000 g | Freq: Once | INTRAVENOUS | Status: AC
  Administered 2018-10-22: 1 g via INTRAVENOUS
  Filled 2018-10-22: qty 10

## 2018-10-22 MED ORDER — MORPHINE SULFATE (PF) 2 MG/ML IV SOLN
2.0000 mg | Freq: Once | INTRAVENOUS | Status: AC
  Administered 2018-10-22: 18:00:00 2 mg via INTRAVENOUS
  Filled 2018-10-22: qty 1

## 2018-10-22 MED ORDER — SODIUM CHLORIDE 0.9 % IV BOLUS
1000.0000 mL | Freq: Once | INTRAVENOUS | Status: AC
  Administered 2018-10-22: 1000 mL via INTRAVENOUS

## 2018-10-22 MED ORDER — SODIUM CHLORIDE 0.9% FLUSH: 3.0000 mL | Freq: Once | INTRAVENOUS | Status: DC

## 2018-10-22 MED ORDER — IOHEXOL 300 MG/ML  SOLN
100.0000 mL | Freq: Once | INTRAMUSCULAR | Status: AC | PRN
  Administered 2018-10-22: 100 mL via INTRAVENOUS

## 2018-10-22 NOTE — ED Notes (Signed)
Patient returned from [place]. Significant other remains at bedside. Patient denies need for anything at this time.

## 2018-10-22 NOTE — ED Triage Notes (Signed)
FIRST NURSE NOTE:  Pt c/o headache, stomach ache and generalized body aches.  Pt requires interpreter for assessment.

## 2018-10-22 NOTE — ED Triage Notes (Signed)
Pt to the er for severe abdominal pain. Pt is in obvious pain and distress. Pt reports fever, cold chills, burning eyes, severe headache, diaphoresis, and cough. Pt has diabetes and has not been able to control her blood sugar. Pt is continuing to take her insulin. Pt has not been tested for covid recently.

## 2018-10-22 NOTE — ED Notes (Signed)
Interpretor requested 

## 2018-10-22 NOTE — ED Notes (Signed)
Power went out as we were signing the esignature pad. Patient signed on paper.

## 2018-10-22 NOTE — ED Notes (Signed)
Patient unhooked from medical equipment so she could use the bathroom. Patient with steady gait to commode. To CT at this time via stretcher. Alert and oriented to CT.

## 2018-10-22 NOTE — ED Provider Notes (Signed)
[facility] Emergency Department Provider Note  ____________________________________________  Time seen: Approximately 6:07 PM  I have reviewed the triage vital signs and the nursing notes.   HISTORY  Chief Complaint Abdominal Pain    HPI Caitlyn Vincent is a 38 y.o. female that presents to the emergency department for evaluation of headache, chills, nasal congestion, cough, body aches, abdominal discomfort, low back pain, nausea, urinary frequency and urgency for 2 days.  No sick contacts.  Patient states that her blood sugar has been high and she is using her insulin as prescribed.  No vomiting, diarrhea.   Past Medical History:  Diagnosis Date  . Anemia   . Diabetes mellitus without complication [facility])     Patient Active Problem List   Diagnosis Date Noted  . Iron deficiency anemia 03/28/2018  . Poorly controlled type 1 diabetes mellitus ([place]) 06/28/2016    Past Surgical History:  Procedure Laterality Date  . CHOLECYSTECTOMY      Prior to Admission medications   Medication Sig Start Date End Date Taking? Authorizing Provider  butalbital-acetaminophen-caffeine (FIORICET, ESGIC) 50-325-40 MG tablet Take 1-2 tablets by mouth every 6 (six) hours as needed. 03/08/18 03/08/19  Earleen Newport, MD  cefdinir (OMNICEF) 300 MG capsule Take 1 capsule (300 mg total) by mouth 2 (two) times daily for 10 days. 10/22/18 11/01/18  Laban Emperor, PA-C  ferrous sulfate (EQL SLOW RELEASE IRON) 160 (50 Fe) MG TBCR SR tablet Take 1 tablet (160 mg total) by mouth daily. 03/08/18   Earleen Newport, MD  Insulin Glargine (LANTUS SOLOSTAR) 100 UNIT/ML Solostar Pen Inject 10 Units into the skin Nightly. 12/27/17   [provider]  insulin regular (NOVOLIN R,HUMULIN R) 100 units/mL injection Inject 25 Units into the skin 3 (three) times daily before meals.    [provider]  Iron-Vitamin C (VITRON-C) 65-125 MG TABS Take 1 tablet by mouth 2 (two) times  daily. 03/28/18   Lequita Asal, MD  meclizine (ANTIVERT) 25 MG tablet Take 1 tablet (25 mg total) by mouth 3 (three) times daily as needed for dizziness or nausea. Patient not taking: Reported on 03/28/2018 03/08/18   Earleen Newport, MD    Allergies Patient has no known allergies.  No family history on file.  Social History Social History   Tobacco Use  . Smoking status: Never Smoker  . Smokeless tobacco: Never Used  Substance Use Topics  . Alcohol use: Yes  . Drug use: No     Review of Systems  Constitutional: Positive for chills. ENT: Positive for nasal congestion. Cardiovascular: No chest pain. Respiratory: Positive for cough. No SOB. Gastrointestinal: Positive for abdominal discomfort.  Positive for nausea. Musculoskeletal: Positive for body aches. Skin: Negative for rash, abrasions, lacerations, ecchymosis. Neurological: Negative for numbness or tingling.  Positive for headache.   ____________________________________________   PHYSICAL EXAM:  VITAL SIGNS: ED Triage Vitals [10/22/18 1522]  Enc Vitals Group     BP (!) 103/40     Pulse Rate (!) 119     Resp (!) 22     Temp 98.9 F (37.2 C)     Temp Source Oral     SpO2 100 %     Weight 137 lb (62.1 kg)     Height 5\' 2"  (1.575 m)     Head Circumference      Peak Flow      Pain Score 10     Pain Loc      Pain Edu?  Excl. in [place]?      Constitutional: Alert and oriented. Well appearing and in no acute distress. Eyes: Conjunctivae are normal. PERRL. EOMI. Head: Atraumatic. ENT:      Ears:      Nose: No congestion/rhinnorhea.      Mouth/Throat: Mucous membranes are moist.  Neck: No stridor.  Cardiovascular: Normal rate, regular rhythm.  Good peripheral circulation. Respiratory: Normal respiratory effort without tachypnea or retractions. Lungs CTAB. Good air entry to the bases with no decreased or absent breath sounds. Gastrointestinal: Bowel sounds 4 quadrants.  Mild suprapubic  tenderness. No guarding or rigidity. No palpable masses. No distention. No CVA tenderness. Musculoskeletal: Full range of motion to all extremities. No gross deformities appreciated.  Neurologic:  Normal speech and language. No gross focal neurologic deficits are appreciated.  Skin:  Skin is warm, dry and intact. No rash noted. Psychiatric: Mood and affect are normal. Speech and behavior are normal. Patient exhibits appropriate insight and judgement.   ____________________________________________   LABS (all labs ordered are listed, but only abnormal results are displayed)  Labs Reviewed  COMPREHENSIVE METABOLIC PANEL - Abnormal; Notable for the following components:      Result Value   Sodium 132 (*)    Chloride 95 (*)    CO2 21 (*)    Glucose, Bld 280 (*)    Total Protein 8.5 (*)    Anion gap 16 (*)    All other components within normal limits  URINALYSIS, COMPLETE (UACMP) WITH MICROSCOPIC - Abnormal; Notable for the following components:   Color, Urine YELLOW (*)    APPearance HAZY (*)    Glucose, UA >=500 (*)    Hgb urine dipstick SMALL (*)    Nitrite POSITIVE (*)    Leukocytes,Ua SMALL (*)    WBC, UA >50 (*)    Bacteria, UA MANY (*)    All other components within normal limits  GLUCOSE, CAPILLARY - Abnormal; Notable for the following components:   Glucose-Capillary 284 (*)    All other components within normal limits  NOVEL CORONAVIRUS, NAA (HOSP ORDER, SEND-OUT TO REF LAB; TAT 18-24 HRS)  LIPASE, BLOOD  CBC  POC URINE PREG, ED  POCT PREGNANCY, URINE  CBG MONITORING, ED   ____________________________________________  EKG   ____________________________________________  RADIOLOGY Robinette Haines, personally viewed and evaluated these images (plain radiographs) as part of my medical decision making, as well as reviewing the written report by the radiologist.  Ct Abdomen Pelvis W Contrast  Result Date: 10/22/2018 CLINICAL DATA:  Recurrent UTI severe abdominal  pain EXAM: CT ABDOMEN AND PELVIS WITH CONTRAST TECHNIQUE: Multidetector CT imaging of the abdomen and pelvis was performed using the standard protocol following bolus administration of intravenous contrast. CONTRAST:  134mL OMNIPAQUE IOHEXOL 300 MG/ML  SOLN COMPARISON:  CT 11/10/2015 FINDINGS: Lower chest: Lung bases demonstrate no acute consolidation or effusion. The heart size is normal Hepatobiliary: Status post cholecystectomy. Stable mild intra and extrahepatic biliary dilatation. No focal hepatic abnormality Pancreas: Unremarkable. No pancreatic ductal dilatation or surrounding inflammatory changes. Spleen: Normal in size without focal abnormality. Adrenals/Urinary Tract: Adrenal glands are normal. No hydronephrosis. Urothelial thickening at the bilateral renal pelvis with urothelial enhancement of the ureters. Very thick-walled appearance of the urinary bladder which is nearly empty. Stomach/Bowel: Stomach is within normal limits. Appendix appears normal. No evidence of bowel wall thickening, distention, or inflammatory changes. Probable pill fragment within the cecum. Vascular/Lymphatic: No significant vascular findings are present. No enlarged abdominal or pelvic lymph nodes. Reproductive: Uterus  unremarkable. 1.6 cm ring-enhancing lesion left ovary likely involuting cyst or corpus luteum. Other: Trace free fluid in the pelvis.  No free air Musculoskeletal: No acute or significant osseous findings. IMPRESSION: 1. Very thick-walled appearance of the urinary bladder which is incompletely distended. Findings could be secondary to cystitis. There is urothelial thickening and mild enhancement of the renal pelvises and ureters, possible ascending urinary tract infection. 2. Status post cholecystectomy. Stable mild intra and extrahepatic biliary enlargement 3. Trace free fluid in the pelvis Electronically Signed   By: Donavan Foil M.D.   On: 10/22/2018 20:27   Dg Chest Port 1 View  Result Date:  10/22/2018 CLINICAL DATA:  Cough, fever, chills EXAM: PORTABLE CHEST 1 VIEW COMPARISON:  None. FINDINGS: The heart size and mediastinal contours are within normal limits. Both lungs are clear. The visualized skeletal structures are unremarkable. IMPRESSION: No acute abnormality of the lungs in AP portable projection. Electronically Signed   By: Eddie Candle M.D.   On: 10/22/2018 17:32    ____________________________________________    PROCEDURES  Procedure(s) performed:    Procedures    Medications  sodium chloride flush (NS) 0.9 % injection 3 mL (has no administration in time range)  sodium chloride 0.9 % bolus 1,000 mL (0 mLs Intravenous Stopped 10/22/18 1815)  cefTRIAXone (ROCEPHIN) 1 g in sodium chloride 0.9 % 100 mL IVPB (0 g Intravenous Stopped 10/22/18 1932)  morphine 2 MG/ML injection 2 mg (2 mg Intravenous Given 10/22/18 1808)  iohexol (OMNIPAQUE) 300 MG/ML solution 100 mL (100 mLs Intravenous Contrast Given 10/22/18 2008)     ____________________________________________   INITIAL IMPRESSION / ASSESSMENT AND PLAN / ED COURSE  Pertinent labs & imaging results that were available during my care of the patient were reviewed by me and considered in my medical decision making (see chart for details).  Review of the [place] CSRS was performed in accordance of the [place] prior to dispensing any controlled drugs.     Patient presented to the emergency department primarily for evaluation of low central abdominal discomfort.  Patient additionally has multiple vague symptoms.  Chest x-ray negative for acute cardiopulmonary processes.  CBC unremarkable.  Blood glucose elevated at 284.  Fluids were given.  Urinalysis consistent with infection.  CT abdomen consistent with cystitis.  Patient was given IV ceftriaxone for infection.  Patient felt improved after medication.  COVID test ordered and is pending.  Patient will be discharged home with prescriptions for [facility]. Patient is to follow up  with primary care as directed. Patient is given ED precautions to return to the ED for any worsening or new symptoms.  Caitlyn Vincent was evaluated in Emergency Department on 10/22/2018 for the symptoms described in the history of present illness. She was evaluated in the context of the global COVID-19 pandemic, which necessitated consideration that the patient might be at risk for infection with the SARS-CoV-2 virus that causes COVID-19. Institutional protocols and algorithms that pertain to the evaluation of patients at risk for COVID-19 are in a state of rapid change based on information released by regulatory bodies including the CDC and federal and state organizations. These policies and algorithms were followed during the patient's care in the ED.   ____________________________________________  FINAL CLINICAL IMPRESSION(S) / ED DIAGNOSES  Final diagnoses:  Acute cystitis without hematuria  Encounter for screening laboratory testing for COVID-19 virus      NEW MEDICATIONS STARTED DURING THIS VISIT:  ED Discharge Orders         Ordered  cefdinir (OMNICEF) 300 MG capsule  2 times daily     10/22/18 2111              This chart was dictated using voice recognition software/Dragon. Despite best efforts to proofread, errors can occur which can change the meaning. Any change was purely unintentional.    Laban Emperor, PA-C 10/22/18 2147    Lilia Pro., MD 10/23/18 1310

## 2018-10-24 LAB — NOVEL CORONAVIRUS, NAA (HOSP ORDER, SEND-OUT TO REF LAB; TAT 18-24 HRS): SARS-CoV-2, NAA: NOT DETECTED

## 2018-11-09 ENCOUNTER — Ambulatory Visit (LOCAL_COMMUNITY_HEALTH_CENTER): Payer: Self-pay

## 2018-11-09 ENCOUNTER — Other Ambulatory Visit: Payer: Self-pay

## 2018-11-09 DIAGNOSIS — Z23 Encounter for immunization: Secondary | ICD-10-CM

## 2018-11-09 NOTE — Progress Notes (Signed)
Pt states she has never had varicella vaccines or disease in the past.

## 2018-11-20 ENCOUNTER — Other Ambulatory Visit: Payer: Self-pay

## 2018-11-20 ENCOUNTER — Ambulatory Visit (LOCAL_COMMUNITY_HEALTH_CENTER): Payer: Self-pay

## 2018-11-20 DIAGNOSIS — Z719 Counseling, unspecified: Secondary | ICD-10-CM

## 2018-11-20 NOTE — Progress Notes (Signed)
Presents for MMR vaccine only. States had flu vaccine recently while at the hospital. Per Thurmond Butts, client received Varivax 11/09/2018. Client and female partner with her at visit counseled that must wait until 12/07/2018 to receive MMR vaccine as both Varivax and MMR are live virus vaccines and if not administered on same day must way 4 weeks between administration of vaccine. Client to information booth to schedule appt for 12/07/2018. Rich Number, RN

## 2018-12-07 ENCOUNTER — Ambulatory Visit (LOCAL_COMMUNITY_HEALTH_CENTER): Payer: Self-pay

## 2018-12-07 ENCOUNTER — Other Ambulatory Visit: Payer: Self-pay

## 2018-12-07 DIAGNOSIS — Z23 Encounter for immunization: Secondary | ICD-10-CM

## 2018-12-07 NOTE — Progress Notes (Signed)
Per client, only vaccine desired today is MMR  Client states is not pregnant. Rich Number, RN

## 2019-01-31 ENCOUNTER — Encounter: Payer: Self-pay | Admitting: Emergency Medicine

## 2019-01-31 ENCOUNTER — Emergency Department: Payer: Self-pay

## 2019-01-31 ENCOUNTER — Emergency Department
Admission: EM | Admit: 2019-01-31 | Discharge: 2019-01-31 | Disposition: A | Discharge status: Home / Self Care | Payer: Self-pay | Attending: Emergency Medicine | Admitting: Emergency Medicine

## 2019-01-31 ENCOUNTER — Other Ambulatory Visit: Payer: Self-pay

## 2019-01-31 DIAGNOSIS — Z794 Long term (current) use of insulin: Secondary | ICD-10-CM | POA: Insufficient documentation

## 2019-01-31 DIAGNOSIS — U071 COVID-19: Secondary | ICD-10-CM | POA: Insufficient documentation

## 2019-01-31 DIAGNOSIS — Z79899 Other long term (current) drug therapy: Secondary | ICD-10-CM | POA: Insufficient documentation

## 2019-01-31 DIAGNOSIS — E109 Type 1 diabetes mellitus without complications: Secondary | ICD-10-CM | POA: Insufficient documentation

## 2019-01-31 LAB — BASIC METABOLIC PANEL
Anion gap: 15 (ref 5–15)
BUN: 19 mg/dL (ref 6–20)
CO2: 20 mmol/L — ABNORMAL LOW (ref 22–32)
Calcium: 10.1 mg/dL (ref 8.9–10.3)
Chloride: 100 mmol/L (ref 98–111)
Creatinine, Ser: 0.91 mg/dL (ref 0.44–1.00)
GFR calc Af Amer: >60 mL/min (ref >60)
GFR calc non Af Amer: >60 mL/min (ref >60)
Glucose, Bld: 356 mg/dL — ABNORMAL HIGH (ref 70–99)
Potassium: 4.2 mmol/L (ref 3.5–5.1)
Sodium: 135 mmol/L (ref 135–145)

## 2019-01-31 LAB — GLUCOSE, CAPILLARY: Glucose-Capillary: 353 mg/dL — ABNORMAL HIGH (ref 70–99)

## 2019-01-31 LAB — CBC
HCT: 41.5 % (ref 36.0–46.0)
Hemoglobin: 14.2 g/dL (ref 12.0–15.0)
MCH: 28 pg (ref 26.0–34.0)
MCHC: 34.2 g/dL (ref 30.0–36.0)
MCV: 81.9 fL (ref 80.0–100.0)
Platelets: 355 10*3/uL (ref 150–400)
RBC: 5.07 MIL/uL (ref 3.87–5.11)
RDW: 12.9 % (ref 11.5–15.5)
WBC: 3.5 10*3/uL — ABNORMAL LOW (ref 4.0–10.5)
nRBC: 0 % (ref 0.0–0.2)

## 2019-01-31 LAB — POC SARS CORONAVIRUS 2 AG: SARS Coronavirus 2 Ag: POSITIVE — AB

## 2019-01-31 LAB — TROPONIN I (HIGH SENSITIVITY): Troponin I (High Sensitivity): <2 ng/L (ref <18)

## 2019-01-31 MED ORDER — KETOROLAC TROMETHAMINE 30 MG/ML IJ SOLN
30.0000 mg | Freq: Once | INTRAMUSCULAR | Status: AC
  Administered 2019-01-31: 30 mg via INTRAMUSCULAR
  Filled 2019-01-31: qty 1

## 2019-01-31 MED ORDER — ONDANSETRON 4 MG PO TBDP
4.0000 mg | ORAL_TABLET | Freq: Once | ORAL | Status: AC
  Administered 2019-01-31: 4 mg via ORAL
  Filled 2019-01-31: qty 1

## 2019-01-31 MED ORDER — SODIUM CHLORIDE 0.9% FLUSH: 3.0000 mL | Freq: Once | INTRAVENOUS | Status: DC

## 2019-01-31 MED ORDER — ONDANSETRON 4 MG PO TBDP: 4.0000 mg | ORAL_TABLET | Freq: Three times a day (TID) | ORAL | 0 refills | Status: DC | PRN

## 2019-01-31 NOTE — ED Notes (Signed)
Via spanish interpreter

## 2019-01-31 NOTE — ED Provider Notes (Signed)
[facility] Emergency Department Provider Note   ____________________________________________    I have reviewed the triage vital signs and the nursing notes.   HISTORY  Chief Complaint Chest Pain  Spanish interpreter used   HPI Caitlyn Vincent is a 39 y.o. female who presents with complaints of chest discomfort, fatigue, nausea, body aches, cough, mild shortness of breath, fevers.  She reports the symptoms been ongoing for 3 days.  She recently had PCR coronavirus test performed but does not have the results yet.  She does have a history of diabetes.  She has not taken anything for this.  She complains that she just feels "so sick ".    Past Medical History:  Diagnosis Date  . Anemia   . Diabetes mellitus without complication [facility])     Patient Active Problem List   Diagnosis Date Noted  . Iron deficiency anemia 03/28/2018  . Poorly controlled type 1 diabetes mellitus ([place]) 06/28/2016    Past Surgical History:  Procedure Laterality Date  . CHOLECYSTECTOMY      Prior to Admission medications   Medication Sig Start Date End Date Taking? Authorizing Provider  butalbital-acetaminophen-caffeine (FIORICET, ESGIC) 50-325-40 MG tablet Take 1-2 tablets by mouth every 6 (six) hours as needed. 03/08/18 03/08/19  Earleen Newport, MD  ferrous sulfate (EQL SLOW RELEASE IRON) 160 (50 Fe) MG TBCR SR tablet Take 1 tablet (160 mg total) by mouth daily. 03/08/18   Earleen Newport, MD  Insulin Glargine (LANTUS SOLOSTAR) 100 UNIT/ML Solostar Pen Inject 10 Units into the skin Nightly. 12/27/17   [provider]  insulin regular (NOVOLIN R,HUMULIN R) 100 units/mL injection Inject 25 Units into the skin 3 (three) times daily before meals.    [provider]  Iron-Vitamin C (VITRON-C) 65-125 MG TABS Take 1 tablet by mouth 2 (two) times daily. 03/28/18   Lequita Asal, MD  meclizine (ANTIVERT) 25 MG tablet Take 1 tablet (25 mg total)  by mouth 3 (three) times daily as needed for dizziness or nausea. Patient not taking: Reported on 03/28/2018 03/08/18   Earleen Newport, MD  ondansetron (ZOFRAN ODT) 4 MG disintegrating tablet Take 1 tablet (4 mg total) by mouth every 8 (eight) hours as needed. 01/31/19   Lavonia Drafts, MD     Allergies Patient has no known allergies.  No family history on file.  Social History Social History   Tobacco Use  . Smoking status: Never Smoker  . Smokeless tobacco: Never Used  Substance Use Topics  . Alcohol use: Yes  . Drug use: No    Review of Systems  Constitutional: Positive fevers Eyes: No visual changes.  ENT: No sore throat. Cardiovascular: Denies chest pain. Respiratory: Positive cough Gastrointestinal: No abdominal pain.  Positive nausea Genitourinary: Negative for dysuria. Musculoskeletal: Myalgias Skin: Negative for rash. Neurological: Positive headaches   ____________________________________________   PHYSICAL EXAM:  VITAL SIGNS: ED Triage Vitals  Enc Vitals Group     BP 01/31/19 0823 122/86     Pulse Rate 01/31/19 0823 91     Resp 01/31/19 0823 18     Temp 01/31/19 0823 98.7 F (37.1 C)     Temp Source 01/31/19 0823 Oral     SpO2 01/31/19 0823 100 %     Weight --      Height --      Head Circumference --      Peak Flow --      Pain Score 01/31/19 0824  10     Pain Loc --      Pain Edu? --      Excl. in [place]? --     Constitutional: Alert and oriented.   Nose: No congestion/rhinnorhea. Mouth/Throat: Mucous membranes are moist.    Cardiovascular: Normal rate, regular rhythm. Kermit Balo peripheral circulation. Respiratory: Normal respiratory effort.  No retractions. Gastrointestinal: Soft and nontender. No distention. Genitourinary: deferred Musculoskeletal: No lower extremity tenderness nor edema.  Warm and well perfused Neurologic:  Normal speech and language. No gross focal neurologic deficits are appreciated.  Skin:  Skin is warm, dry and  intact. No rash noted. Psychiatric: Mood and affect are normal. Speech and behavior are normal.  ____________________________________________   LABS (all labs ordered are listed, but only abnormal results are displayed)  Labs Reviewed  BASIC METABOLIC PANEL - Abnormal; Notable for the following components:      Result Value   CO2 20 (*)    Glucose, Bld 356 (*)    All other components within normal limits  CBC - Abnormal; Notable for the following components:   WBC 3.5 (*)    All other components within normal limits  GLUCOSE, CAPILLARY - Abnormal; Notable for the following components:   Glucose-Capillary 353 (*)    All other components within normal limits  POC SARS CORONAVIRUS 2 AG - Abnormal; Notable for the following components:   SARS Coronavirus 2 Ag POSITIVE (*)    All other components within normal limits  POC URINE PREG, ED  POC SARS CORONAVIRUS 2 AG -  ED  CBG MONITORING, ED  TROPONIN I (HIGH SENSITIVITY)   ____________________________________________  EKG  ED ECG REPORT I, Lavonia Drafts, the attending physician, personally viewed and interpreted this ECG.  Date: 01/31/2019  Rhythm: normal sinus rhythm QRS Axis: normal Intervals: normal ST/T Wave abnormalities: normal Narrative Interpretation: no evidence of acute ischemia  ____________________________________________  RADIOLOGY  Chest x-ray unremarkable ____________________________________________   PROCEDURES  Procedure(s) performed: No  Procedures   Critical Care performed: No ____________________________________________   INITIAL IMPRESSION / ASSESSMENT AND PLAN / ED COURSE  Pertinent labs & imaging results that were available during my care of the patient were reviewed by me and considered in my medical decision making (see chart for details).  Patient presents with symptoms consistent with novel coronavirus.  This was confirmed by POC coronavirus testing which returned positive.  Her  lab work is overall quite reassuring, troponin is normal.  Mildly elevated blood glucose given her history of diabetes.  Chest x-ray is clear.  Work of breathing is normal.  Treated with IM Toradol, ODT Zofran, will Rx Zofran for home, recommend supportive care, return precautions discussed    ____________________________________________   FINAL CLINICAL IMPRESSION(S) / ED DIAGNOSES  Final diagnoses:  COVID-19        Note:  This document was prepared using Dragon voice recognition software and may include unintentional dictation errors.   Lavonia Drafts, MD 01/31/19 1250

## 2019-01-31 NOTE — ED Triage Notes (Signed)
Cough , chest pain , SHOB x2 days ,headache, achy all over  attempted to make appointment for clinic yesterday , pt currently anxious, redirected pt to control her breathing. Pt more calm respiratory pattern normal and non labored.  CBG high according to patient " in the 400's)

## 2019-04-06 ENCOUNTER — Ambulatory Visit: Payer: Self-pay | Attending: Internal Medicine

## 2019-04-06 DIAGNOSIS — Z23 Encounter for immunization: Secondary | ICD-10-CM

## 2019-04-06 NOTE — Progress Notes (Signed)
   Covid-19 Vaccination Clinic  Name:  Caitlyn Vincent    MRN: [id number] DOB: [birth year]  04/06/2019  [name] was observed post Covid-19 immunization for 15 minutes without incident. She was provided with Vaccine Information Sheet and instruction to access the V-Safe system.   [name] was instructed to call 911 with any severe reactions post vaccine: Marland Kitchen Difficulty breathing  . Swelling of face and throat  . A fast heartbeat  . A bad rash all over body  . Dizziness and weakness   Immunizations Administered    Name Date Dose VIS Date Route   Pfizer COVID-19 Vaccine 04/06/2019  4:47 PM 0.3 mL 12/29/2018 Intramuscular   Manufacturer: [place]   Lot: [id number]   [place]: [id number]

## 2019-04-27 ENCOUNTER — Ambulatory Visit: Payer: Self-pay | Attending: Internal Medicine

## 2019-04-27 DIAGNOSIS — Z23 Encounter for immunization: Secondary | ICD-10-CM

## 2019-04-27 NOTE — Progress Notes (Signed)
   Covid-19 Vaccination Clinic  Name:  Caitlyn Vincent    MRN: [id number] DOB: 1980/07/27  04/27/2019  [name] was observed post Covid-19 immunization for 15 minutes without incident. She was provided with Vaccine Information Sheet and instruction to access the V-Safe system.   [name] was instructed to call 911 with any severe reactions post vaccine: Marland Kitchen Difficulty breathing  . Swelling of face and throat  . A fast heartbeat  . A bad rash all over body  . Dizziness and weakness   Immunizations Administered    Name Date Dose VIS Date Route   Pfizer COVID-19 Vaccine 04/27/2019  4:22 PM 0.3 mL 12/29/2018 Intramuscular   Manufacturer: [place]   Lot: [id number]   [place]: [id number]

## 2020-04-17 ENCOUNTER — Other Ambulatory Visit: Payer: Self-pay

## 2020-04-17 ENCOUNTER — Encounter: Payer: Self-pay | Admitting: Emergency Medicine

## 2020-04-17 ENCOUNTER — Emergency Department
Admission: EM | Admit: 2020-04-17 | Discharge: 2020-04-17 | Disposition: A | Discharge status: Home / Self Care | Payer: 59 | Attending: Emergency Medicine | Admitting: Emergency Medicine

## 2020-04-17 ENCOUNTER — Emergency Department: Payer: 59

## 2020-04-17 DIAGNOSIS — Z794 Long term (current) use of insulin: Secondary | ICD-10-CM | POA: Diagnosis not present

## 2020-04-17 DIAGNOSIS — R109 Unspecified abdominal pain: Secondary | ICD-10-CM | POA: Diagnosis present

## 2020-04-17 DIAGNOSIS — D259 Leiomyoma of uterus, unspecified: Secondary | ICD-10-CM | POA: Diagnosis not present

## 2020-04-17 DIAGNOSIS — E1069 Type 1 diabetes mellitus with other specified complication: Secondary | ICD-10-CM | POA: Diagnosis not present

## 2020-04-17 DIAGNOSIS — E785 Hyperlipidemia, unspecified: Secondary | ICD-10-CM | POA: Insufficient documentation

## 2020-04-17 DIAGNOSIS — E1065 Type 1 diabetes mellitus with hyperglycemia: Secondary | ICD-10-CM | POA: Diagnosis not present

## 2020-04-17 DIAGNOSIS — R739 Hyperglycemia, unspecified: Secondary | ICD-10-CM

## 2020-04-17 DIAGNOSIS — R102 Pelvic and perineal pain: Secondary | ICD-10-CM

## 2020-04-17 HISTORY — DX: Hyperlipidemia, unspecified: E78.5

## 2020-04-17 LAB — BASIC METABOLIC PANEL
Anion gap: 9 (ref 5–15)
BUN: 13 mg/dL (ref 6–20)
CO2: 23 mmol/L (ref 22–32)
Calcium: 9.3 mg/dL (ref 8.9–10.3)
Chloride: 101 mmol/L (ref 98–111)
Creatinine, Ser: 0.73 mg/dL (ref 0.44–1.00)
GFR, Estimated: >60 mL/min (ref >60)
Glucose, Bld: 412 mg/dL — ABNORMAL HIGH (ref 70–99)
Potassium: 3.9 mmol/L (ref 3.5–5.1)
Sodium: 133 mmol/L — ABNORMAL LOW (ref 135–145)

## 2020-04-17 LAB — CBC
HCT: 31.7 % — ABNORMAL LOW (ref 36.0–46.0)
Hemoglobin: 10.7 g/dL — ABNORMAL LOW (ref 12.0–15.0)
MCH: 28 pg (ref 26.0–34.0)
MCHC: 33.8 g/dL (ref 30.0–36.0)
MCV: 83 fL (ref 80.0–100.0)
Platelets: 375 10*3/uL (ref 150–400)
RBC: 3.82 MIL/uL — ABNORMAL LOW (ref 3.87–5.11)
RDW: 12.5 % (ref 11.5–15.5)
WBC: 6.9 10*3/uL (ref 4.0–10.5)
nRBC: 0 % (ref 0.0–0.2)

## 2020-04-17 LAB — PROTIME-INR
INR: 1 (ref 0.8–1.2)
Prothrombin Time: 13 seconds (ref 11.4–15.2)

## 2020-04-17 LAB — T4, FREE: Free T4: 0.73 ng/dL (ref 0.61–1.12)

## 2020-04-17 LAB — TSH: TSH: 4.52 u[IU]/mL — ABNORMAL HIGH (ref 0.350–4.500)

## 2020-04-17 LAB — BETA-HYDROXYBUTYRIC ACID: Beta-Hydroxybutyric Acid: 0.16 mmol/L (ref 0.05–0.27)

## 2020-04-17 LAB — POC URINE PREG, ED: Preg Test, Ur: NEGATIVE

## 2020-04-17 LAB — CBG MONITORING, ED: Glucose-Capillary: 293 mg/dL — ABNORMAL HIGH (ref 70–99)

## 2020-04-17 MED ORDER — MEDROXYPROGESTERONE ACETATE 5 MG PO TABS: 20.0000 mg | ORAL_TABLET | Freq: Three times a day (TID) | ORAL | 0 refills | Status: DC

## 2020-04-17 MED ORDER — MORPHINE SULFATE (PF) 4 MG/ML IV SOLN
4.0000 mg | Freq: Once | INTRAVENOUS | Status: AC
Start: 2020-04-17 — End: 2020-04-17
  Administered 2020-04-17: 4 mg via INTRAVENOUS
  Filled 2020-04-17: qty 1

## 2020-04-17 MED ORDER — ACETAMINOPHEN 500 MG PO TABS
1000.0000 mg | ORAL_TABLET | Freq: Once | ORAL | Status: AC
  Administered 2020-04-17: 1000 mg via ORAL
  Filled 2020-04-17: qty 2

## 2020-04-17 MED ORDER — KETOROLAC TROMETHAMINE 30 MG/ML IJ SOLN
30.0000 mg | Freq: Once | INTRAMUSCULAR | Status: AC
  Administered 2020-04-17: 30 mg via INTRAVENOUS
  Filled 2020-04-17: qty 1

## 2020-04-17 MED ORDER — LACTATED RINGERS IV BOLUS
1000.0000 mL | Freq: Once | INTRAVENOUS | Status: AC
  Administered 2020-04-17: 1000 mL via INTRAVENOUS

## 2020-04-17 MED ORDER — MEDROXYPROGESTERONE ACETATE 10 MG PO TABS
20.0000 mg | ORAL_TABLET | ORAL | Status: AC
  Administered 2020-04-17: 20 mg via ORAL
  Filled 2020-04-17: qty 2

## 2020-04-17 MED ORDER — INSULIN ASPART 100 UNIT/ML ~~LOC~~ SOLN
0.0000 [IU] | SUBCUTANEOUS | Status: DC
  Administered 2020-04-17: 8 [IU] via SUBCUTANEOUS
  Filled 2020-04-17: qty 1

## 2020-04-17 NOTE — ED Notes (Signed)
POC preg performed, results negative.

## 2020-04-17 NOTE — ED Triage Notes (Signed)
Patient in via POV, reports having strong menstrual cycles since January, receiving depo injection but without any relief.  Significant bleeding currently, reports saturating a pad every hour.  States scheduled IUD placement for 04/21/20 but expresses concern due to amount of bleeding and pelvic pain.    Vitals WDL, NAD noted at this time.

## 2020-04-17 NOTE — ED Notes (Addendum)
Pt reports vaginal bleeding since January. Has seen provider for this who put her on depo shots but she states these have not helped. States over the past 2 weeks the bleeding and pain has increased. Reports bleeding with large clots, and has been using tampons, pads and diapers all at once and still has to change them every 45 minutes. States her pain has worsened and now feels like contractions. Pt also states she's been taking iron. -n/v. AAO NAD VSS

## 2020-04-17 NOTE — ED Notes (Signed)
Pt signed discharge paperwork on paper copy

## 2020-04-17 NOTE — ED Notes (Signed)
Husband at bedside.  

## 2020-04-17 NOTE — ED Provider Notes (Signed)
[facility] Emergency Department Provider Note  ____________________________________________   Event Date/Time   First MD Initiated Contact with Patient 04/17/20 2049     (approximate)  I have reviewed the triage vital signs and the nursing notes.   HISTORY  Chief Complaint Vaginal Bleeding   HPI Caitlyn Vincent is a 40 y.o. female with a past medical history of DM, HDL, iron deficiency anemia and menometrorrhagia who presents for assessment of some worsening abdominal pain over the last 2 weeks associated with increase in daily bleeding think she is going through about a pad every couple hours.  States she was put on Depo shot in January by her gynecologist but has not been able to see a gynecologist since then and feels she is not getting better the last 2 weeks.  She endorses some diarrhea last week but none this week.  She denies any chest pain, cough, shortness of breath, vomiting, urinary symptoms, rash, extremity pain, headedness, dizziness or any other acute sick symptoms.  She is not on any blood thinners.  She is on insulin has been taking this as directed.  She has only been taking some Tylenol for pain.  No clearly fitting aggravating factors.  No recent traumatic injuries or falls.  She is not on any blood thinners.         Past Medical History:  Diagnosis Date  . Anemia   . Diabetes mellitus without complication ([place])   . Hyperlipidemia     Patient Active Problem List   Diagnosis Date Noted  . Iron deficiency anemia 03/28/2018  . Poorly controlled type 1 diabetes mellitus ([place]) 06/28/2016    Past Surgical History:  Procedure Laterality Date  . CHOLECYSTECTOMY      Prior to Admission medications   Medication Sig Start Date End Date Taking? Authorizing Provider  medroxyPROGESTERone (PROVERA) 5 MG tablet Take 4 tablets (20 mg total) by mouth in the morning, at noon, and at bedtime for 7 days. 04/17/20 04/24/20 Yes Lucrezia Starch,  MD  ferrous sulfate (EQL SLOW RELEASE IRON) 160 (50 Fe) MG TBCR SR tablet Take 1 tablet (160 mg total) by mouth daily. 03/08/18   Earleen Newport, MD  Insulin Glargine (LANTUS SOLOSTAR) 100 UNIT/ML Solostar Pen Inject 10 Units into the skin Nightly. 12/27/17   [provider]  insulin regular (NOVOLIN R,HUMULIN R) 100 units/mL injection Inject 25 Units into the skin 3 (three) times daily before meals.    [provider]  Iron-Vitamin C (VITRON-C) 65-125 MG TABS Take 1 tablet by mouth 2 (two) times daily. 03/28/18   Lequita Asal, MD  meclizine (ANTIVERT) 25 MG tablet Take 1 tablet (25 mg total) by mouth 3 (three) times daily as needed for dizziness or nausea. Patient not taking: Reported on 03/28/2018 03/08/18   Earleen Newport, MD  ondansetron (ZOFRAN ODT) 4 MG disintegrating tablet Take 1 tablet (4 mg total) by mouth every 8 (eight) hours as needed. 01/31/19   Lavonia Drafts, MD    Allergies Patient has no known allergies.  No family history on file.  Social History Social History   Tobacco Use  . Smoking status: Never Smoker  . Smokeless tobacco: Never Used  Vaping Use  . Vaping Use: Never used  Substance Use Topics  . Alcohol use: Yes  . Drug use: No    Review of Systems  ROS    ____________________________________________   PHYSICAL EXAM:  VITAL SIGNS: ED Triage Vitals  Enc  Vitals Group     BP 04/17/20 1930 119/77     Pulse Rate 04/17/20 1930 84     Resp 04/17/20 1930 19     Temp 04/17/20 1930 98.3 F (36.8 C)     Temp Source 04/17/20 1930 Oral     SpO2 04/17/20 1930 99 %     Weight 04/17/20 1938 140 lb (63.5 kg)     Height 04/17/20 1938 5\' 2"  (1.575 m)     Head Circumference --      Peak Flow --      Pain Score 04/17/20 1938 9     Pain Loc --      Pain Edu? --      Excl. in [place]? --    Vitals:   04/17/20 2122 04/17/20 2327  BP: 102/75 104/86  Pulse: 89 98  Resp: 20 18  Temp:    SpO2: 98% 99%   Physical Exam Vitals and  nursing note reviewed.  Constitutional:      General: She is not in acute distress.    Appearance: She is well-developed.  HENT:     Head: Normocephalic and atraumatic.     Right Ear: External ear normal.     Left Ear: External ear normal.     Nose: Nose normal.  Eyes:     Conjunctiva/sclera: Conjunctivae normal.  Cardiovascular:     Rate and Rhythm: Normal rate and regular rhythm.     Heart sounds: No murmur heard.   Pulmonary:     Effort: Pulmonary effort is normal. No respiratory distress.     Breath sounds: Normal breath sounds.  Abdominal:     Palpations: Abdomen is soft.     Tenderness: There is abdominal tenderness in the suprapubic area. There is no right CVA tenderness or left CVA tenderness.  Musculoskeletal:     Cervical back: Neck supple.  Skin:    General: Skin is warm and dry.     Capillary Refill: Capillary refill takes less than 2 seconds.  Neurological:     Mental Status: She is alert and oriented to person, place, and time.  Psychiatric:        Mood and Affect: Mood normal.      ____________________________________________   LABS (all labs ordered are listed, but only abnormal results are displayed)  Labs Reviewed  CBC - Abnormal; Notable for the following components:      Result Value   RBC 3.82 (*)    Hemoglobin 10.7 (*)    HCT 31.7 (*)    All other components within normal limits  BASIC METABOLIC PANEL - Abnormal; Notable for the following components:   Sodium 133 (*)    Glucose, Bld 412 (*)    All other components within normal limits  TSH - Abnormal; Notable for the following components:   TSH 4.520 (*)    All other components within normal limits  BLOOD GAS, VENOUS - Abnormal; Notable for the following components:   pCO2, Ven 42 (*)    All other components within normal limits  CBG MONITORING, ED - Abnormal; Notable for the following components:   Glucose-Capillary 293 (*)    All other components within normal limits  PROTIME-INR   BETA-HYDROXYBUTYRIC ACID  T4, FREE  HEMOGLOBIN A1C  URINALYSIS, COMPLETE (UACMP) WITH MICROSCOPIC  POC URINE PREG, ED   ____________________________________________  EKG ____________________________________________  RADIOLOGY  ED MD interpretation: Multiple intramural and subserosal uterine fibroids.  No other clear acute abdominal pelvic process.  No  evidence of torsion or adnexal mass.  Official radiology report(s): US PELVIC COMPLETE W TRANSVAGINAL AND TORSION R/O  Result Date: 04/17/2020 CLINICAL DATA:  Three weeks of pelvic pain EXAM: TRANSABDOMINAL AND TRANSVAGINAL ULTRASOUND OF PELVIS DOPPLER ULTRASOUND OF OVARIES TECHNIQUE: Both transabdominal and transvaginal ultrasound examinations of the pelvis were performed. Transabdominal technique was performed for global imaging of the pelvis including uterus, ovaries, adnexal regions, and pelvic cul-de-sac. It was necessary to proceed with endovaginal exam following the transabdominal exam to visualize the uterus, endometrium, and ovaries. Color and duplex Doppler ultrasound was utilized to evaluate blood flow to the ovaries. COMPARISON:  CT 10/22/2018 FINDINGS: Uterus Measurements: 9.8 x 5.7 x 6.1 cm = volume: 179 mL. Multiple heterogeneous intramural and subserosal uterine fibroids. Largest fibroids include a 1.8 x 1.8 x 1.8 cm intramural fibroid at the posterior right lower uterine segment, a 1.5 x 1.6 x 1.5 cm fibroid at the anterior left lower uterine segment, and a 1.6 x 1.6 x 1.2 cm subserosal fundal fibroid to the right of midline. Endometrium Thickness: 11.5 mm, non thickened.  No focal abnormality visualized. Right ovary Measurements: 3.6 x 1.7 x 2.6 cm = volume: 8 mL. Normal appearance/no adnexal mass. Left ovary Measurements: 2.9 x 2.2 x 2.3 cm = volume: 8 mL. Normal appearance/no adnexal mass. Pulsed Doppler evaluation of both ovaries demonstrates normal low-resistance arterial and venous waveforms. Other findings Small volume  anechoic free fluid in the deep pelvis is nonspecific though often physiologic in a reproductive age female. IMPRESSION: 1. Multiple intramural and subserosal uterine fibroids, largest measuring up to 1.8 cm at the posterior right lower uterine segment. 2. Small volume anechoic free fluid in the deep pelvis, nonspecific though often physiologic in a reproductive age female. 3. No evidence of adnexal mass or torsion. Electronically Signed   By: Lovena Le M.D.   On: 04/17/2020 22:06    ____________________________________________   PROCEDURES  Procedure(s) performed (including Critical Care):  Procedures   ____________________________________________   INITIAL IMPRESSION / ASSESSMENT AND PLAN / ED COURSE     Patient presents with above to history exam for acute on chronic pelvic discomfort and uterine bleeding.  On arrival she is afebrile and hemodynamically stable.  Patient does appear quite uncomfortable on exam and initial differential included diverticulitis, appendicitis, kidney stone, cystitis and possible torsion given significant fibroids seen on ultrasound I suspect this is likely theology of her symptoms.  She has no fever or elevation of white blood cell count.  She is anemic at 10.7 and patient states she is aware of this and is currently on iron.  She has no chest pain lightheadedness dizziness or shortness of breath to suggest this symptomatic at this time.  Her BMP is remarkable for a glucose of 412 although there is no evidence of DKA.  In addition VBG shows a pH of 7.4 with a PCO2 of 42 also not consistent with DKA.  Beta hydroxybutyrate is 0.16.  Patient states that she has been taking her insulin but her sugars will sometimes go above baseline when she is in significant discomfort.  She was given fluids and some sliding scale insulin and on recheck her sugars were noted to have down trended to 293.  She has no urinary symptoms to suggest cystitis at this time.  Advised  patient that she should definitely have this followed up with her PCP as her sugars were certainly too high today and she may need to have her insulin regimen adjusted.  Given unclear source  for her bleeding with otherwise stable vitals and no indication for transfusion at this time I believe she is safe for discharge with outpatient GYN follow-up.  She does states she has an appointment for early next week.  She was given a dose of medroxyprogesterone and Rx was written for short course of this.  Also advised scheduled ibuprofen.  On reassessment patient stated she felt much better after below noted analgesia.  She voiced understanding of this plan.  She was discharged stable condition.  Strict return precautions advised and discussed.       ____________________________________________   FINAL CLINICAL IMPRESSION(S) / ED DIAGNOSES  Final diagnoses:  Pelvic pain  Uterine leiomyoma, unspecified location  Hyperglycemia    Medications  insulin aspart (novoLOG) injection 0-15 Units (8 Units Subcutaneous Given 04/17/20 2225)  morphine 4 MG/ML injection 4 mg (4 mg Intravenous Given 04/17/20 2225)  acetaminophen (TYLENOL) tablet 1,000 mg (1,000 mg Oral Given 04/17/20 2224)  lactated ringers bolus 1,000 mL (0 mLs Intravenous Stopped 04/17/20 2311)  ketorolac (TORADOL) 30 MG/ML injection 30 mg (30 mg Intravenous Given 04/17/20 2311)  medroxyPROGESTERone (PROVERA) tablet 20 mg (20 mg Oral Given 04/17/20 2311)     ED Discharge Orders         Ordered    medroxyPROGESTERone (PROVERA) 5 MG tablet  3 times daily        04/17/20 2301           Note:  This document was prepared using Dragon voice recognition software and may include unintentional dictation errors.   Lucrezia Starch, MD 04/18/20 [phone]

## 2020-04-17 NOTE — ED Notes (Signed)
Pelvic exam not needed per Tamala Julian MD

## 2020-04-17 NOTE — ED Notes (Signed)
RT called and made aware VBG sent

## 2020-04-18 LAB — HEMOGLOBIN A1C
Hgb A1c MFr Bld: 10.5 % — ABNORMAL HIGH (ref 4.8–5.6)
Mean Plasma Glucose: 254.65 mg/dL

## 2020-04-24 LAB — BLOOD GAS, VENOUS
Acid-Base Excess: 1.7 mmol/L (ref 0.0–2.0)
Bicarbonate: 26.6 mmol/L (ref 20.0–28.0)
O2 Saturation: 38.1 %
Patient temperature: 37
pCO2, Ven: 42 mmHg — ABNORMAL LOW (ref 44.0–60.0)
pH, Ven: 7.41 (ref 7.250–7.430)

## 2020-06-25 ENCOUNTER — Encounter: Payer: Self-pay | Admitting: Hematology and Oncology

## 2020-07-29 ENCOUNTER — Other Ambulatory Visit: Payer: Self-pay

## 2020-07-29 ENCOUNTER — Ambulatory Visit (INDEPENDENT_AMBULATORY_CARE_PROVIDER_SITE_OTHER): Payer: 59 | Admitting: Certified Nurse Midwife

## 2020-07-29 ENCOUNTER — Encounter: Payer: Self-pay | Admitting: Certified Nurse Midwife

## 2020-07-29 VITALS — BP 112/71 | HR 73 | Ht 62.0 in | Wt 149.6 lb

## 2020-07-29 DIAGNOSIS — Z30017 Encounter for initial prescription of implantable subdermal contraceptive: Secondary | ICD-10-CM

## 2020-07-29 NOTE — Patient Instructions (Signed)
Nexplanon Instructions After Insertion  Keep bandage clean and dry for 24 hours  May use ice/Tylenol/Ibuprofen for soreness or pain  If you develop fever, drainage or increased warmth from incision site-contact office immediately   

## 2020-07-29 NOTE — Progress Notes (Signed)
Caitlyn Vincent is a 40 y.o. year old Caucasian female here for Nexplanon insertion.  No LMP recorded (lmp unknown)., currently on POP , has history of abnormal uterine bleeding and had IUD place but it came out due to Fibroids. She is looking for alternative method that may help her bleeding.   Risks/benefits/side effects of Nexplanon have been discussed and her questions have been answered.  Specifically, a failure rate of 01/998 has been reported, with an increased failure rate if pt takes [place] and/or antiseizure medicaitons.  Arne Cleveland is aware of the common side effect of irregular bleeding, which the incidence of decreases over time.  BP 112/71   Pulse 73   Ht 5\' 2"  (1.575 m)   Wt 149 lb 9.6 oz (67.9 kg)   LMP  (LMP Unknown)   BMI 27.36 kg/m   No results found for this or any previous visit (from the past 24 hour(s)).   She is right-handed, so her left arm, approximately 4 inches proximal from the elbow, was cleansed with alcohol and anesthetized with 2cc of 2% Lidocaine.  The area was cleansed again with betadine and the Nexplanon was inserted per manufacturer's recommendations without difficulty.  A steri-strip and pressure bandage were applied.  Pt was instructed to keep the area clean and dry, remove pressure bandage in 24 hours, and keep insertion site covered with the steri-strip for 3-5 days.  Back up contraception was recommended for 2 weeks.  She was given a card indicating date Nexplanon was inserted and date it needs to be removed. Follow-up PRN problems.  Philip Aspen, ,CNM

## 2020-09-12 ENCOUNTER — Ambulatory Visit
Admission: RE | Admit: 2020-09-12 | Discharge: 2020-09-12 | Disposition: A | Discharge status: Home / Self Care | Payer: 59 | Source: Ambulatory Visit | Attending: Internal Medicine | Admitting: Internal Medicine

## 2020-09-12 ENCOUNTER — Other Ambulatory Visit: Payer: Self-pay

## 2020-09-12 VITALS — BP 108/68 | HR 117 | Temp 98.3°F | Resp 18

## 2020-09-12 DIAGNOSIS — N946 Dysmenorrhea, unspecified: Secondary | ICD-10-CM | POA: Insufficient documentation

## 2020-09-12 DIAGNOSIS — N939 Abnormal uterine and vaginal bleeding, unspecified: Secondary | ICD-10-CM | POA: Diagnosis present

## 2020-09-12 LAB — POCT URINALYSIS DIP (MANUAL ENTRY)
Bilirubin, UA: NEGATIVE
Glucose, UA: >=1000 mg/dL — AB
Ketones, POC UA: NEGATIVE mg/dL
Leukocytes, UA: NEGATIVE
Nitrite, UA: NEGATIVE
Protein Ur, POC: 30 mg/dL — AB
Spec Grav, UA: 1.02 (ref 1.010–1.025)
Urobilinogen, UA: 0.2 E.U./dL
pH, UA: 5.5 (ref 5.0–8.0)

## 2020-09-12 LAB — POCT FASTING CBG KUC MANUAL ENTRY: POCT Glucose (KUC): 308 mg/dL — AB (ref 70–99)

## 2020-09-12 LAB — POCT URINE PREGNANCY: Preg Test, Ur: NEGATIVE

## 2020-09-12 MED ORDER — ONDANSETRON 4 MG PO TBDP
4.0000 mg | ORAL_TABLET | Freq: Once | ORAL | Status: AC
  Administered 2020-09-12: 4 mg via ORAL

## 2020-09-12 NOTE — ED Provider Notes (Signed)
[facility]    CSN: [id number] Arrival date & time: 09/12/20  0820      History   Chief Complaint Chief Complaint  Patient presents with   Vaginal Bleeding    HPI Caitlyn Vincent is a 40 y.o. female.   Patient presents with abnormal vaginal bleeding that has been present for 8 months.  Patient states that vaginal bleeding started as a normal menstrual cycle in January, and then bleeding did not stop.  Bleeding has become more heavy over the past few months.  Patient states that she has to wear diapers at night and has to change her pad and tampon every 40 minutes to an hour throughout the day.  Was first evaluated for vaginal bleeding in March by gynecologist.  Transvaginal ultrasound was performed showing fibroids.  An IUD was placed at that time, the patient states that IUD fell out on his own due to fibroids.  Patient seen again in July.  Nexplanon birth control was placed in the arm as well as patient being prescribed birth control pills.  Patient states that bleeding is worsened since this time.  Patient also starting to develop weakness, fatigue, "feeling cold".  Abdominal cramping is also present in the abdomen that has been present since bleeding started.  Patient does have an appointment with gynecologist in 2 months.   Vaginal Bleeding  Past Medical History:  Diagnosis Date   Anemia    Diabetes mellitus without complication ([place])    Hyperlipidemia     Patient Active Problem List   Diagnosis Date Noted   Iron deficiency anemia 03/28/2018   Poorly controlled type 1 diabetes mellitus ([place]) 06/28/2016    Past Surgical History:  Procedure Laterality Date   CHOLECYSTECTOMY      OB History   No obstetric history on file.      Home Medications    Prior to Admission medications   Medication Sig Start Date End Date Taking? Authorizing Provider  ferrous sulfate (EQL SLOW RELEASE IRON) 160 (50 Fe) MG TBCR SR tablet Take 1 tablet (160 mg total) by  mouth daily. 03/08/18   Earleen Newport, MD  insulin glargine (LANTUS) 100 UNIT/ML Solostar Pen Inject 10 Units into the skin Nightly. 12/27/17   [provider]  insulin regular (NOVOLIN R,HUMULIN R) 100 units/mL injection Inject 25 Units into the skin 3 (three) times daily before meals.    [provider]  Iron-Vitamin C (VITRON-C) 65-125 MG TABS Take 1 tablet by mouth 2 (two) times daily. 03/28/18   Lequita Asal, MD  medroxyPROGESTERone (PROVERA) 5 MG tablet Take 4 tablets (20 mg total) by mouth in the morning, at noon, and at bedtime for 7 days. 04/17/20 04/24/20  Lucrezia Starch, MD    Family History History reviewed. No pertinent family history.  Social History Social History   Tobacco Use   Smoking status: Never   Smokeless tobacco: Never  Vaping Use   Vaping Use: Never used  Substance Use Topics   Alcohol use: Yes   Drug use: No     Allergies   Patient has no known allergies.   Review of Systems Review of Systems Per HPI  Physical Exam Triage Vital Signs ED Triage Vitals  Enc Vitals Group     BP 09/12/20 0908 108/68     Pulse Rate 09/12/20 0908 (!) 117     Resp 09/12/20 0908 18     Temp 09/12/20 0908 98.3 F (36.8 C)  Temp Source 09/12/20 0908 Oral     SpO2 09/12/20 0908 98 %     Weight --      Height --      Head Circumference --      Peak Flow --      Pain Score 09/12/20 0909 5     Pain Loc --      Pain Edu? --      Excl. in [place]? --    No data found.  Updated Vital Signs BP 108/68 (BP Location: Left Arm)   Pulse (!) 117   Temp 98.3 F (36.8 C) (Oral)   Resp 18   SpO2 98%   Visual Acuity Right Eye Distance:   Left Eye Distance:   Bilateral Distance:    Right Eye Near:   Left Eye Near:    Bilateral Near:     Physical Exam Constitutional:      Appearance: Normal appearance.  HENT:     Head: Normocephalic and atraumatic.  Eyes:     Extraocular Movements: Extraocular movements intact.      Conjunctiva/sclera: Conjunctivae normal.  Cardiovascular:     Rate and Rhythm: Normal rate and regular rhythm.     Pulses: Normal pulses.     Heart sounds: Normal heart sounds.  Pulmonary:     Effort: Pulmonary effort is normal.     Breath sounds: Normal breath sounds.  Abdominal:     General: Abdomen is flat. Bowel sounds are normal.     Palpations: Abdomen is soft.     Tenderness: There is abdominal tenderness in the right lower quadrant, suprapubic area and left lower quadrant.     Hernia: No hernia is present.  Skin:    General: Skin is warm and dry.  Neurological:     General: No focal deficit present.     Mental Status: She is alert and oriented to person, place, and time. Mental status is at baseline.  Psychiatric:        Mood and Affect: Mood normal.        Behavior: Behavior normal.        Thought Content: Thought content normal.        Judgment: Judgment normal.     UC Treatments / Results  Labs (all labs ordered are listed, but only abnormal results are displayed) Labs Reviewed  POCT URINALYSIS DIP (MANUAL ENTRY) - Abnormal; Notable for the following components:      Result Value   Color, UA red (*)    Clarity, UA cloudy (*)    Glucose, UA >=1,000 (*)    Blood, UA large (*)    Protein Ur, POC =30 (*)    All other components within normal limits  POCT FASTING CBG [facility] MANUAL ENTRY - Abnormal; Notable for the following components:   POCT Glucose ([facility]) 308 (*)    All other components within normal limits  COMPREHENSIVE METABOLIC PANEL  CBC  TSH  POCT URINE PREGNANCY  CERVICOVAGINAL ANCILLARY ONLY    EKG   Radiology No results found.  Procedures Procedures (including critical care time)  Medications Ordered in UC Medications  ondansetron (ZOFRAN-ODT) disintegrating tablet 4 mg (4 mg Oral Given 09/12/20 1006)    Initial Impression / Assessment and Plan / UC Course  I have reviewed the triage vital signs and the nursing notes.  Pertinent labs &  imaging results that were available during my care of the patient were reviewed by me and considered in my medical decision making (  see chart for details).     Urine preg negative.  Urinalysis not showing signs of infection.  STD vaginal swab pending.  CMP, CBC, TSH pending to rule out other worrisome causes.  Transvaginal ultrasound at previous visit in March showed fibroids, so suspect that fibroids are still causing increased vaginal bleeding.  Patient will ultimately need to be evaluated by gynecologist for further evaluation and management due to numerous interventions already being attempted including birth control pills, Nexplanon, IUD.  Discussed this with  patient. Advised patient that it may be beneficial for her to be evaluated at the hospital or at the women's unit due to increasing weakness and fatigue especially if unable to get into gynecologist for 2 months.  Patient was provided contact information for this.  Vital signs stable at this time.  Do not think patient is in need of any emergent medical attention at this time.   Patient has increased glucose on urinalysis.  Patient states that blood glucoses have been ranging in the 300s at home.  Last visit with PCP was 2 months ago and patient has another appt this month for recheck.  Advised patient to monitor blood glucose very closely and to follow-up with PCP as soon as possible for further evaluation and management of blood sugars.  Blood glucose seems consistent with previous lab work in chart.  Discussed strict return precautions. Patient verbalized understanding and is agreeable with plan.   Interpreter used throughout patient interaction. Final Clinical Impressions(s) / UC Diagnoses   Final diagnoses:  Vaginal bleeding, abnormal  Dysmenorrhea     Discharge Instructions      Urinalysis did not show signs of urinary tract infection.  Urine pregnancy was negative.  STD vaginal swab is pending for infection.  Blood work is also  pending.  We will call if it is positive . It is important that you be evaluated by gynecologist.  Please go to the hospital if symptoms worsen before you are able to be seen by gynecologist.     ED Prescriptions   None    PDMP not reviewed this encounter.   Odis Luster, FNP 09/12/20 1013

## 2020-09-12 NOTE — Discharge Instructions (Addendum)
Urinalysis did not show signs of urinary tract infection.  Urine pregnancy was negative.  STD vaginal swab is pending for infection.  Blood work is also pending.  We will call if it is positive . It is important that you be evaluated by gynecologist.  Please go to the hospital if symptoms worsen before you are able to be seen by gynecologist.

## 2020-09-12 NOTE — ED Triage Notes (Signed)
Pt c/o vaginal bleeding, weakness, fatigue, feeling cold, and abdominal cramping. States back in Jan her vaginal bleeding started and did not stop when she expected it to. States she went to the ED and they implanted an IUD that fell out on its own within a week due to "fibroids." States she then "took the pills" that were also unsuccessful at controlling the bleeding. After the pills, she then had an implant inserted into her arm at some point in July. The bleeding still occurs described as needing to wear an adult diaper and change it every 40-64mns b/c it becomes saturated, and the clots are "much larger than nickels." States she has an OBGYN but when she tried to make an apt it would take 2 months. States she does not think this can wait b/c of her weakness and fatigue.

## 2020-09-13 LAB — COMPREHENSIVE METABOLIC PANEL
ALT: 13 IU/L (ref 0–32)
AST: 12 IU/L (ref 0–40)
Albumin/Globulin Ratio: 2 (ref 1.2–2.2)
Albumin: 4.3 g/dL (ref 3.8–4.8)
Alkaline Phosphatase: 66 IU/L (ref 44–121)
BUN/Creatinine Ratio: 18 (ref 9–23)
BUN: 13 mg/dL (ref 6–24)
Bilirubin Total: 0.4 mg/dL (ref 0.0–1.2)
CO2: 20 mmol/L (ref 20–29)
Calcium: 9.5 mg/dL (ref 8.7–10.2)
Chloride: 101 mmol/L (ref 96–106)
Creatinine, Ser: 0.72 mg/dL (ref 0.57–1.00)
Globulin, Total: 2.2 g/dL (ref 1.5–4.5)
Glucose: 308 mg/dL — ABNORMAL HIGH (ref 65–99)
Potassium: 4.7 mmol/L (ref 3.5–5.2)
Sodium: 136 mmol/L (ref 134–144)
Total Protein: 6.5 g/dL (ref 6.0–8.5)
eGFR: 107 mL/min/{1.73_m2} (ref >59)

## 2020-09-13 LAB — CBC
Hematocrit: 27.7 % — ABNORMAL LOW (ref 34.0–46.6)
Hemoglobin: 9.2 g/dL — ABNORMAL LOW (ref 11.1–15.9)
MCH: 28.8 pg (ref 26.6–33.0)
MCHC: 33.2 g/dL (ref 31.5–35.7)
MCV: 87 fL (ref 79–97)
Platelets: 383 10*3/uL (ref 150–450)
RBC: 3.19 x10E6/uL — ABNORMAL LOW (ref 3.77–5.28)
RDW: 13.4 % (ref 11.7–15.4)
WBC: 4.8 10*3/uL (ref 3.4–10.8)

## 2020-09-13 LAB — TSH: TSH: 3.64 u[IU]/mL (ref 0.450–4.500)

## 2020-09-15 LAB — CERVICOVAGINAL ANCILLARY ONLY
Bacterial Vaginitis (gardnerella): NEGATIVE
Candida Glabrata: NEGATIVE
Candida Vaginitis: NEGATIVE
Chlamydia: NEGATIVE
Comment: NEGATIVE
Comment: NEGATIVE
Comment: NEGATIVE
Comment: NEGATIVE
Comment: NEGATIVE
Comment: NORMAL
Neisseria Gonorrhea: NEGATIVE
Trichomonas: NEGATIVE

## 2020-09-24 ENCOUNTER — Ambulatory Visit (INDEPENDENT_AMBULATORY_CARE_PROVIDER_SITE_OTHER): Payer: 59 | Admitting: Obstetrics and Gynecology

## 2020-09-24 ENCOUNTER — Other Ambulatory Visit: Payer: Self-pay

## 2020-09-24 ENCOUNTER — Encounter: Payer: Self-pay | Admitting: Obstetrics and Gynecology

## 2020-09-24 VITALS — BP 104/68 | HR 73 | Ht 62.0 in | Wt 150.1 lb

## 2020-09-24 DIAGNOSIS — D6489 Other specified anemias: Secondary | ICD-10-CM

## 2020-09-24 DIAGNOSIS — D219 Benign neoplasm of connective and other soft tissue, unspecified: Secondary | ICD-10-CM | POA: Diagnosis not present

## 2020-09-24 DIAGNOSIS — N938 Other specified abnormal uterine and vaginal bleeding: Secondary | ICD-10-CM

## 2020-09-24 MED ORDER — NORETHINDRONE ACETATE 5 MG PO TABS: 5.0000 mg | ORAL_TABLET | Freq: Two times a day (BID) | ORAL | 0 refills | Status: DC

## 2020-09-24 MED ORDER — NORETHINDRONE ACETATE 5 MG PO TABS: 5.0000 mg | ORAL_TABLET | Freq: Every day | ORAL | 0 refills | Status: DC

## 2020-09-24 NOTE — Progress Notes (Signed)
HPI:      Caitlyn Vincent is a 40 y.o. No obstetric history on file. who LMP was No LMP recorded (lmp unknown).  Subjective:   She presents today because she has been having heavy irregular bleeding over 9 months.  She has tried OCPs without success.  She had an IUD that was unsuccessful.  She now has Nexplanon for 2 months but has continued to bleed.  She was seen in the emergency department earlier this week and her hemoglobin was 4.  She received a blood transfusion.  She has not taking progesterone 10 mg twice daily in addition to her Nexplanon and her bleeding is starting to decrease. Patient has known uterine fibroids intramural and subserosal. She has completed childbearing and is generally tired of the bleeding.  She has been told both by the emergency room doctor and a gynecologist at [facility] that hysterectomy is the answer.  She is requesting hysterectomy.    Hx: The following portions of the patient's history were reviewed and updated as appropriate:             She  has a past medical history of Anemia, Diabetes mellitus without complication ([place]), Hyperlipidemia, and Vaginal bleeding. She does not have any pertinent problems on file. She  has a past surgical history that includes Cholecystectomy. Her family history is not on file. She  reports that she has never smoked. She has never used smokeless tobacco. She reports current alcohol use. She reports that she does not use drugs. She has a current medication list which includes the following prescription(s): ferrous sulfate, insulin glargine, insulin regular, iron-vitamin c, norethindrone, and medroxyprogesterone. She has No Known Allergies.       Review of Systems:  Review of Systems  Constitutional: Denied constitutional symptoms, night sweats, recent illness, fatigue, fever, insomnia and weight loss.  Eyes: Denied eye symptoms, eye pain, photophobia, vision change and visual disturbance.  Ears/Nose/Throat/Neck: Denied  ear, nose, throat or neck symptoms, hearing loss, nasal discharge, sinus congestion and sore throat.  Cardiovascular: Denied cardiovascular symptoms, arrhythmia, chest pain/pressure, edema, exercise intolerance, orthopnea and palpitations.  Respiratory: Denied pulmonary symptoms, asthma, pleuritic pain, productive sputum, cough, dyspnea and wheezing.  Gastrointestinal: Denied, gastro-esophageal reflux, melena, nausea and vomiting.  Genitourinary: See HPI for additional information.   Musculoskeletal: Denied musculoskeletal symptoms, stiffness, swelling, muscle weakness and myalgia.  Dermatologic: Denied dermatology symptoms, rash and scar.  Neurologic: Denied neurology symptoms, dizziness, headache, neck pain and syncope.  Psychiatric: Denied psychiatric symptoms, anxiety and depression.  Endocrine: Denied endocrine symptoms including hot flashes and night sweats.   Meds:   Current Outpatient Medications on File Prior to Visit  Medication Sig Dispense Refill   ferrous sulfate (EQL SLOW RELEASE IRON) 160 (50 Fe) MG TBCR SR tablet Take 1 tablet (160 mg total) by mouth daily. 30 each 6   insulin glargine (LANTUS) 100 UNIT/ML Solostar Pen Inject 10 Units into the skin Nightly.     insulin regular (NOVOLIN R,HUMULIN R) 100 units/mL injection Inject 25 Units into the skin 3 (three) times daily before meals.     Iron-Vitamin C (VITRON-C) 65-125 MG TABS Take 1 tablet by mouth 2 (two) times daily. 60 tablet 1   medroxyPROGESTERone (PROVERA) 5 MG tablet Take 4 tablets (20 mg total) by mouth in the morning, at noon, and at bedtime for 7 days. 84 tablet 0   No current facility-administered medications on file prior to visit.      Objective:  Vitals:   09/24/20 1132  BP: 104/68  Pulse: 73   Filed Weights   09/24/20 1132  Weight: 150 lb 1.6 oz (68.1 kg)              Ultrasound results reviewed          Assessment:    No obstetric history on file. Patient Active Problem List    Diagnosis Date Noted   Iron deficiency anemia 03/28/2018   Poorly controlled type 1 diabetes mellitus ([place]) 06/28/2016     1. DUB (dysfunctional uterine bleeding)   2. Fibroid   3. Anemia due to other cause, not classified     Patient has significant bleeding despite multiple progesterone options including oral, IUD, and Nexplanon.  She has also failed OCPs.   Plan:            1.  She continues to take progesterone switching to Aygestin to keep from bleeding.  She will continue oral iron.  In this way we will attempt to increase her globin high enough for her to undergo surgery.  Plan repeat CBC in 4 weeks followed by preop and subsequent hysterectomy for anemia uterine fibroids dysfunctional bleeding.  Orders Orders Placed This Encounter  Procedures   CBC With Diff/Platelet     Meds ordered this encounter  Medications   DISCONTD: norethindrone (AYGESTIN) 5 MG tablet    Sig: Take 1 tablet (5 mg total) by mouth 2 (two) times daily for 21 days. As directed    Dispense:  42 tablet    Refill:  0   norethindrone (AYGESTIN) 5 MG tablet    Sig: Take 1 tablet (5 mg total) by mouth daily. As directed    Dispense:  50 tablet    Refill:  0      F/U  Return in about 4 weeks (around 10/22/2020). I spent 36 minutes involved in the care of this patient preparing to see the patient by obtaining and reviewing her medical history (including labs, imaging tests and prior procedures), documenting clinical information in the electronic health record (EHR), counseling and coordinating care plans, writing and sending prescriptions, ordering tests or procedures and in direct communicating with the patient and medical staff discussing pertinent items from her history and physical exam.  Finis Bud, M.D. 09/24/2020 12:59 PM

## 2020-10-22 ENCOUNTER — Other Ambulatory Visit: Payer: 59

## 2020-10-22 ENCOUNTER — Other Ambulatory Visit: Payer: Self-pay

## 2020-10-22 DIAGNOSIS — D6489 Other specified anemias: Secondary | ICD-10-CM

## 2020-10-22 DIAGNOSIS — D219 Benign neoplasm of connective and other soft tissue, unspecified: Secondary | ICD-10-CM

## 2020-10-22 DIAGNOSIS — N938 Other specified abnormal uterine and vaginal bleeding: Secondary | ICD-10-CM

## 2020-10-23 LAB — CBC WITH DIFF/PLATELET
Basophils Absolute: 0.1 10*3/uL (ref 0.0–0.2)
Basos: 1 %
EOS (ABSOLUTE): 0.3 10*3/uL (ref 0.0–0.4)
Eos: 5 %
Hematocrit: 42.1 % (ref 34.0–46.6)
Hemoglobin: 13.6 g/dL (ref 11.1–15.9)
Immature Grans (Abs): 0 10*3/uL (ref 0.0–0.1)
Immature Granulocytes: 0 %
Lymphocytes Absolute: 2.1 10*3/uL (ref 0.7–3.1)
Lymphs: 29 %
MCH: 28.5 pg (ref 26.6–33.0)
MCHC: 32.3 g/dL (ref 31.5–35.7)
MCV: 88 fL (ref 79–97)
Monocytes Absolute: 0.5 10*3/uL (ref 0.1–0.9)
Monocytes: 7 %
Neutrophils Absolute: 4.2 10*3/uL (ref 1.4–7.0)
Neutrophils: 58 %
Platelets: 418 10*3/uL (ref 150–450)
RBC: 4.77 x10E6/uL (ref 3.77–5.28)
RDW: 12.3 % (ref 11.7–15.4)
WBC: 7.3 10*3/uL (ref 3.4–10.8)

## 2020-10-24 ENCOUNTER — Encounter: Payer: Self-pay | Admitting: Obstetrics and Gynecology

## 2020-10-24 ENCOUNTER — Ambulatory Visit (INDEPENDENT_AMBULATORY_CARE_PROVIDER_SITE_OTHER): Payer: 59 | Admitting: Obstetrics and Gynecology

## 2020-10-24 ENCOUNTER — Other Ambulatory Visit: Payer: Self-pay

## 2020-10-24 VITALS — BP 108/77 | HR 69 | Ht 62.0 in | Wt 152.5 lb

## 2020-10-24 DIAGNOSIS — D6489 Other specified anemias: Secondary | ICD-10-CM | POA: Diagnosis not present

## 2020-10-24 DIAGNOSIS — Z01818 Encounter for other preprocedural examination: Secondary | ICD-10-CM

## 2020-10-24 DIAGNOSIS — D219 Benign neoplasm of connective and other soft tissue, unspecified: Secondary | ICD-10-CM

## 2020-10-24 DIAGNOSIS — Z8639 Personal history of other endocrine, nutritional and metabolic disease: Secondary | ICD-10-CM

## 2020-10-24 DIAGNOSIS — N938 Other specified abnormal uterine and vaginal bleeding: Secondary | ICD-10-CM | POA: Diagnosis not present

## 2020-10-24 MED ORDER — NORETHINDRONE ACETATE 5 MG PO TABS: 5.0000 mg | ORAL_TABLET | Freq: Two times a day (BID) | ORAL | 0 refills | Status: DC

## 2020-10-24 MED ORDER — METRONIDAZOLE 500 MG PO TABS
500.0000 mg | ORAL_TABLET | Freq: Two times a day (BID) | ORAL | 0 refills | Status: DC
Start: 2020-10-24 — End: 2020-11-10

## 2020-10-24 NOTE — H&P (View-Only) (Signed)
PRE-OPERATIVE HISTORY AND PHYSICAL EXAM  PCP:  [facility] Subjective:   HPI:  Caitlyn Vincent is a 40 y.o. [phone].  No LMP recorded (lmp unknown).  She presents today for a pre-op discussion and PE.  She has the following symptoms: Menorrhagia.  Uterine fibroids.  Previous hemoglobin of 4.   Review of Systems:   Constitutional: Denied constitutional symptoms, night sweats, recent illness, fatigue, fever, insomnia and weight loss.  Eyes: Denied eye symptoms, eye pain, photophobia, vision change and visual disturbance.  Ears/Nose/Throat/Neck: Denied ear, nose, throat or neck symptoms, hearing loss, nasal discharge, sinus congestion and sore throat.  Cardiovascular: Denied cardiovascular symptoms, arrhythmia, chest pain/pressure, edema, exercise intolerance, orthopnea and palpitations.  Respiratory: Denied pulmonary symptoms, asthma, pleuritic pain, productive sputum, cough, dyspnea and wheezing.  Gastrointestinal: Denied, gastro-esophageal reflux, melena, nausea and vomiting.  Genitourinary: Denied genitourinary symptoms including symptomatic vaginal discharge, pelvic relaxation issues, and urinary complaints.  Musculoskeletal: Denied musculoskeletal symptoms, stiffness, swelling, muscle weakness and myalgia.  Dermatologic: Denied dermatology symptoms, rash and scar.  Neurologic: Denied neurology symptoms, dizziness, headache, neck pain and syncope.  Psychiatric: Denied psychiatric symptoms, anxiety and depression.  Endocrine: Denied endocrine symptoms including hot flashes and night sweats.   OB History  Gravida Para Term Preterm AB Living  2 2 2  0 0 2  SAB IAB Ectopic Multiple Live Births  0 0 0 0 2    # Outcome Date GA Lbr Len/2nd Weight Sex Delivery Anes PTL Lv  2 Term 03/30/07    M Vag-Spont   LIV  1 Term 07/27/97    F Vag-Spont   LIV    Past Medical History:  Diagnosis Date   Anemia    Diabetes mellitus without complication ([facility])     Hyperlipidemia    Vaginal bleeding     Past Surgical History:  Procedure Laterality Date   CHOLECYSTECTOMY        SOCIAL HISTORY:  Social History   Tobacco Use  Smoking Status Never  Smokeless Tobacco Never   Social History   Substance and Sexual Activity  Alcohol Use Yes    Social History   Substance and Sexual Activity  Drug Use No    Family History  Problem Relation Age of Onset   Healthy Mother    Healthy Father     ALLERGIES:  Patient has no known allergies.  MEDS:   Current Outpatient Medications on File Prior to Visit  Medication Sig Dispense Refill   etonogestrel (NEXPLANON) 68 MG IMPL implant 1 each by Subdermal route once.     ferrous sulfate (EQL SLOW RELEASE IRON) 160 (50 Fe) MG TBCR SR tablet Take 1 tablet (160 mg total) by mouth daily. 30 each 6   insulin glargine (LANTUS) 100 UNIT/ML Solostar Pen Inject 10 Units into the skin Nightly.     insulin regular (NOVOLIN R,HUMULIN R) 100 units/mL injection Inject 25 Units into the skin 3 (three) times daily before meals.     Iron-Vitamin C (VITRON-C) 65-125 MG TABS Take 1 tablet by mouth 2 (two) times daily. 60 tablet 1   No current facility-administered medications on file prior to visit.    Meds ordered this encounter  Medications   norethindrone (AYGESTIN) 5 MG tablet    Sig: Take 1 tablet (5 mg total) by mouth 2 (two) times daily for 21 days. As directed    Dispense:  42 tablet    Refill:  0   metroNIDAZOLE (FLAGYL)  500 MG tablet    Sig: Take 1 tablet (500 mg total) by mouth 2 (two) times daily. Begin 5 days prior to scheduled surgery as directed.    Dispense:  10 tablet    Refill:  0     Physical examination BP 108/77 (BP Location: Left Arm, Patient Position: Sitting, Cuff Size: Normal)   Pulse 69   Ht 5\' 2"  (1.575 m)   Wt 152 lb 8 oz (69.2 kg)   LMP  (LMP Unknown)   BMI 27.89 kg/m   General NAD, Conversant  HEENT Atraumatic; Op clear with mmm.  Normo-cephalic. Pupils reactive.  Anicteric sclerae  Thyroid/Neck Smooth without nodularity or enlargement. Normal ROM.  Neck Supple.  Skin No rashes, lesions or ulceration. Normal palpated skin turgor. No nodularity.  Breasts: No masses or discharge.  Symmetric.  No axillary adenopathy.  Lungs: Clear to auscultation.No rales or wheezes. Normal Respiratory effort, no retractions.  Heart: NSR.  No murmurs or rubs appreciated. No periferal edema  Abdomen: Soft.  Non-tender.  No masses.  No HSM. No hernia  Extremities: Moves all appropriately.  Normal ROM for age. No lymphadenopathy.  Neuro: Oriented to PPT.  Normal mood. Normal affect.     Pelvic:   Vulva: Normal appearance.  No lesions.  Vagina: No lesions or abnormalities noted.  Support: Normal pelvic support.  Urethra No masses tenderness or scarring.  Meatus Normal size without lesions or prolapse.  Cervix: Normal ectropion.  No lesions.  Anus: Normal exam.  No lesions.  Perineum: Normal exam.  No lesions.        Bimanual   Uterus: 10 weeks non-tender.  Mobile.  AV.  Adnexae: No masses.  Non-tender to palpation.  Cul-de-sac: Negative for abnormality.   Assessment:   [id number] Patient Active Problem List   Diagnosis Date Noted   Iron deficiency anemia 03/28/2018   Poorly controlled type 1 diabetes mellitus ([place]) 06/28/2016    1. Preop examination   2. DUB (dysfunctional uterine bleeding)   3. Fibroid   4. Anemia due to other cause, not classified   5. H/O insulin dependent diabetes mellitus    Patient has tried multiple methods of bleeding control including IUD and Nexplanon.  These have all failed.  She has been told multiple times in the past by other physicians that she needs a hysterectomy.  She is specifically requesting hysterectomy at this time   Plan:   Orders: Meds ordered this encounter  Medications   norethindrone (AYGESTIN) 5 MG tablet    Sig: Take 1 tablet (5 mg total) by mouth 2 (two) times daily for 21 days. As directed    Dispense:  42  tablet    Refill:  0   metroNIDAZOLE (FLAGYL) 500 MG tablet    Sig: Take 1 tablet (500 mg total) by mouth 2 (two) times daily. Begin 5 days prior to scheduled surgery as directed.    Dispense:  10 tablet    Refill:  0     1.  LAVH -Nexplanon removal    L

## 2020-10-24 NOTE — H&P (Signed)
PRE-OPERATIVE HISTORY AND PHYSICAL EXAM  PCP:  Center, [facility] Subjective:   HPI:  Caitlyn Vincent is a 40 y.o. [phone].  No LMP recorded (lmp unknown).  She presents today for a pre-op discussion and PE.  She has the following symptoms: Menorrhagia.  Uterine fibroids.  Previous hemoglobin of 4.   Review of Systems:   Constitutional: Denied constitutional symptoms, night sweats, recent illness, fatigue, fever, insomnia and weight loss.  Eyes: Denied eye symptoms, eye pain, photophobia, vision change and visual disturbance.  Ears/Nose/Throat/Neck: Denied ear, nose, throat or neck symptoms, hearing loss, nasal discharge, sinus congestion and sore throat.  Cardiovascular: Denied cardiovascular symptoms, arrhythmia, chest pain/pressure, edema, exercise intolerance, orthopnea and palpitations.  Respiratory: Denied pulmonary symptoms, asthma, pleuritic pain, productive sputum, cough, dyspnea and wheezing.  Gastrointestinal: Denied, gastro-esophageal reflux, melena, nausea and vomiting.  Genitourinary: Denied genitourinary symptoms including symptomatic vaginal discharge, pelvic relaxation issues, and urinary complaints.  Musculoskeletal: Denied musculoskeletal symptoms, stiffness, swelling, muscle weakness and myalgia.  Dermatologic: Denied dermatology symptoms, rash and scar.  Neurologic: Denied neurology symptoms, dizziness, headache, neck pain and syncope.  Psychiatric: Denied psychiatric symptoms, anxiety and depression.  Endocrine: Denied endocrine symptoms including hot flashes and night sweats.   OB History  Gravida Para Term Preterm AB Living  2 2 2  0 0 2  SAB IAB Ectopic Multiple Live Births  0 0 0 0 2    # Outcome Date GA Lbr Len/2nd Weight Sex Delivery Anes PTL Lv  2 Term 03/30/07    M Vag-Spont   LIV  1 Term 07/27/97    F Vag-Spont   LIV    Past Medical History:  Diagnosis Date   Anemia    Diabetes mellitus without complication (HCC)     Hyperlipidemia    Vaginal bleeding     Past Surgical History:  Procedure Laterality Date   CHOLECYSTECTOMY        SOCIAL HISTORY:  Social History   Tobacco Use  Smoking Status Never  Smokeless Tobacco Never   Social History   Substance and Sexual Activity  Alcohol Use Yes    Social History   Substance and Sexual Activity  Drug Use No    Family History  Problem Relation Age of Onset   Healthy Mother    Healthy Father     ALLERGIES:  Patient has no known allergies.  MEDS:   Current Outpatient Medications on File Prior to Visit  Medication Sig Dispense Refill   etonogestrel (NEXPLANON) 68 MG IMPL implant 1 each by Subdermal route once.     ferrous sulfate (EQL SLOW RELEASE IRON) 160 (50 Fe) MG TBCR SR tablet Take 1 tablet (160 mg total) by mouth daily. 30 each 6   insulin glargine (LANTUS) 100 UNIT/ML Solostar Pen Inject 10 Units into the skin Nightly.     insulin regular (NOVOLIN R,HUMULIN R) 100 units/mL injection Inject 25 Units into the skin 3 (three) times daily before meals.     Iron-Vitamin C (VITRON-C) 65-125 MG TABS Take 1 tablet by mouth 2 (two) times daily. 60 tablet 1   No current facility-administered medications on file prior to visit.    Meds ordered this encounter  Medications   norethindrone (AYGESTIN) 5 MG tablet    Sig: Take 1 tablet (5 mg total) by mouth 2 (two) times daily for 21 days. As directed    Dispense:  42 tablet    Refill:  0   metroNIDAZOLE (FLAGYL)  500 MG tablet    Sig: Take 1 tablet (500 mg total) by mouth 2 (two) times daily. Begin 5 days prior to scheduled surgery as directed.    Dispense:  10 tablet    Refill:  0     Physical examination BP 108/77 (BP Location: Left Arm, Patient Position: Sitting, Cuff Size: Normal)   Pulse 69   Ht 5\' 2"  (1.575 m)   Wt 152 lb 8 oz (69.2 kg)   LMP  (LMP Unknown)   BMI 27.89 kg/m   General NAD, Conversant  HEENT Atraumatic; Op clear with mmm.  Normo-cephalic. Pupils reactive.  Anicteric sclerae  Thyroid/Neck Smooth without nodularity or enlargement. Normal ROM.  Neck Supple.  Skin No rashes, lesions or ulceration. Normal palpated skin turgor. No nodularity.  Breasts: No masses or discharge.  Symmetric.  No axillary adenopathy.  Lungs: Clear to auscultation.No rales or wheezes. Normal Respiratory effort, no retractions.  Heart: NSR.  No murmurs or rubs appreciated. No periferal edema  Abdomen: Soft.  Non-tender.  No masses.  No HSM. No hernia  Extremities: Moves all appropriately.  Normal ROM for age. No lymphadenopathy.  Neuro: Oriented to PPT.  Normal mood. Normal affect.     Pelvic:   Vulva: Normal appearance.  No lesions.  Vagina: No lesions or abnormalities noted.  Support: Normal pelvic support.  Urethra No masses tenderness or scarring.  Meatus Normal size without lesions or prolapse.  Cervix: Normal ectropion.  No lesions.  Anus: Normal exam.  No lesions.  Perineum: Normal exam.  No lesions.        Bimanual   Uterus: 10 weeks non-tender.  Mobile.  AV.  Adnexae: No masses.  Non-tender to palpation.  Cul-de-sac: Negative for abnormality.   Assessment:   G2P2002 Patient Active Problem List   Diagnosis Date Noted   Iron deficiency anemia 03/28/2018   Poorly controlled type 1 diabetes mellitus ([place]) 06/28/2016    1. Preop examination   2. DUB (dysfunctional uterine bleeding)   3. Fibroid   4. Anemia due to other cause, not classified   5. H/O insulin dependent diabetes mellitus    Patient has tried multiple methods of bleeding control including IUD and Nexplanon.  These have all failed.  She has been told multiple times in the past by other physicians that she needs a hysterectomy.  She is specifically requesting hysterectomy at this time   Plan:   Orders: Meds ordered this encounter  Medications   norethindrone (AYGESTIN) 5 MG tablet    Sig: Take 1 tablet (5 mg total) by mouth 2 (two) times daily for 21 days. As directed    Dispense:  42  tablet    Refill:  0   metroNIDAZOLE (FLAGYL) 500 MG tablet    Sig: Take 1 tablet (500 mg total) by mouth 2 (two) times daily. Begin 5 days prior to scheduled surgery as directed.    Dispense:  10 tablet    Refill:  0     1.  LAVH -Nexplanon removal    L

## 2020-10-24 NOTE — Progress Notes (Signed)
Pt present for follow up for vaginal bleeding and to discuss surgery.

## 2020-10-24 NOTE — Progress Notes (Signed)
PRE-OPERATIVE HISTORY AND PHYSICAL EXAM  PCP:  Center, [facility] Subjective:   HPI:  Caitlyn Vincent is a 40 y.o. [phone].  No LMP recorded (lmp unknown).  She presents today for a pre-op discussion and PE.  She has the following symptoms: Menorrhagia.  Uterine fibroids.  Previous hemoglobin of 4.   Review of Systems:   Constitutional: Denied constitutional symptoms, night sweats, recent illness, fatigue, fever, insomnia and weight loss.  Eyes: Denied eye symptoms, eye pain, photophobia, vision change and visual disturbance.  Ears/Nose/Throat/Neck: Denied ear, nose, throat or neck symptoms, hearing loss, nasal discharge, sinus congestion and sore throat.  Cardiovascular: Denied cardiovascular symptoms, arrhythmia, chest pain/pressure, edema, exercise intolerance, orthopnea and palpitations.  Respiratory: Denied pulmonary symptoms, asthma, pleuritic pain, productive sputum, cough, dyspnea and wheezing.  Gastrointestinal: Denied, gastro-esophageal reflux, melena, nausea and vomiting.  Genitourinary: Denied genitourinary symptoms including symptomatic vaginal discharge, pelvic relaxation issues, and urinary complaints.  Musculoskeletal: Denied musculoskeletal symptoms, stiffness, swelling, muscle weakness and myalgia.  Dermatologic: Denied dermatology symptoms, rash and scar.  Neurologic: Denied neurology symptoms, dizziness, headache, neck pain and syncope.  Psychiatric: Denied psychiatric symptoms, anxiety and depression.  Endocrine: Denied endocrine symptoms including hot flashes and night sweats.   OB History  Gravida Para Term Preterm AB Living  2 2 2  0 0 2  SAB IAB Ectopic Multiple Live Births  0 0 0 0 2    # Outcome Date GA Lbr Len/2nd Weight Sex Delivery Anes PTL Lv  2 Term 03/30/07    M Vag-Spont   LIV  1 Term 07/27/97    F Vag-Spont   LIV    Past Medical History:  Diagnosis Date   Anemia    Diabetes mellitus without complication (HCC)     Hyperlipidemia    Vaginal bleeding     Past Surgical History:  Procedure Laterality Date   CHOLECYSTECTOMY        SOCIAL HISTORY:  Social History   Tobacco Use  Smoking Status Never  Smokeless Tobacco Never   Social History   Substance and Sexual Activity  Alcohol Use Yes    Social History   Substance and Sexual Activity  Drug Use No    Family History  Problem Relation Age of Onset   Healthy Mother    Healthy Father     ALLERGIES:  Patient has no known allergies.  MEDS:   Current Outpatient Medications on File Prior to Visit  Medication Sig Dispense Refill   etonogestrel (NEXPLANON) 68 MG IMPL implant 1 each by Subdermal route once.     ferrous sulfate (EQL SLOW RELEASE IRON) 160 (50 Fe) MG TBCR SR tablet Take 1 tablet (160 mg total) by mouth daily. 30 each 6   insulin glargine (LANTUS) 100 UNIT/ML Solostar Pen Inject 10 Units into the skin Nightly.     insulin regular (NOVOLIN R,HUMULIN R) 100 units/mL injection Inject 25 Units into the skin 3 (three) times daily before meals.     Iron-Vitamin C (VITRON-C) 65-125 MG TABS Take 1 tablet by mouth 2 (two) times daily. 60 tablet 1   No current facility-administered medications on file prior to visit.    Meds ordered this encounter  Medications   norethindrone (AYGESTIN) 5 MG tablet    Sig: Take 1 tablet (5 mg total) by mouth 2 (two) times daily for 21 days. As directed    Dispense:  42 tablet    Refill:  0   metroNIDAZOLE (FLAGYL)  500 MG tablet    Sig: Take 1 tablet (500 mg total) by mouth 2 (two) times daily. Begin 5 days prior to scheduled surgery as directed.    Dispense:  10 tablet    Refill:  0     Physical examination BP 108/77 (BP Location: Left Arm, Patient Position: Sitting, Cuff Size: Normal)   Pulse 69   Ht 5\' 2"  (1.575 m)   Wt 152 lb 8 oz (69.2 kg)   LMP  (LMP Unknown)   BMI 27.89 kg/m   General NAD, Conversant  HEENT Atraumatic; Op clear with mmm.  Normo-cephalic. Pupils reactive.  Anicteric sclerae  Thyroid/Neck Smooth without nodularity or enlargement. Normal ROM.  Neck Supple.  Skin No rashes, lesions or ulceration. Normal palpated skin turgor. No nodularity.  Breasts: No masses or discharge.  Symmetric.  No axillary adenopathy.  Lungs: Clear to auscultation.No rales or wheezes. Normal Respiratory effort, no retractions.  Heart: NSR.  No murmurs or rubs appreciated. No periferal edema  Abdomen: Soft.  Non-tender.  No masses.  No HSM. No hernia  Extremities: Moves all appropriately.  Normal ROM for age. No lymphadenopathy.  Neuro: Oriented to PPT.  Normal mood. Normal affect.     Pelvic:   Vulva: Normal appearance.  No lesions.  Vagina: No lesions or abnormalities noted.  Support: Normal pelvic support.  Urethra No masses tenderness or scarring.  Meatus Normal size without lesions or prolapse.  Cervix: Normal ectropion.  No lesions.  Anus: Normal exam.  No lesions.  Perineum: Normal exam.  No lesions.        Bimanual   Uterus: 10 weeks non-tender.  Mobile.  AV.  Adnexae: No masses.  Non-tender to palpation.  Cul-de-sac: Negative for abnormality.   Assessment:   G2P2002 Patient Active Problem List   Diagnosis Date Noted   Iron deficiency anemia 03/28/2018   Poorly controlled type 1 diabetes mellitus ([place]) 06/28/2016    1. Preop examination   2. DUB (dysfunctional uterine bleeding)   3. Fibroid   4. Anemia due to other cause, not classified   5. H/O insulin dependent diabetes mellitus    Patient has tried multiple methods of bleeding control including IUD and Nexplanon.  These have all failed.  She has been told multiple times in the past by other physicians that she needs a hysterectomy.  She is specifically requesting hysterectomy at this time   Plan:   Orders: Meds ordered this encounter  Medications   norethindrone (AYGESTIN) 5 MG tablet    Sig: Take 1 tablet (5 mg total) by mouth 2 (two) times daily for 21 days. As directed    Dispense:  42  tablet    Refill:  0   metroNIDAZOLE (FLAGYL) 500 MG tablet    Sig: Take 1 tablet (500 mg total) by mouth 2 (two) times daily. Begin 5 days prior to scheduled surgery as directed.    Dispense:  10 tablet    Refill:  0     1.  LAVH -Nexplanon removal  Pre-op discussions regarding Risks and Benefits of her scheduled surgery.  LAVH The procedure of Laparoscopic Assisted Vaginal Hysterectomy was described to the patient in detail.  We reviewed the rationale for Hysterectomy and the patient was again informed of other nonsurgical management possibilities for her condition.  She has considered these other options, and desires a Hysterectomy.  We have reviewed the fact that Hysterectomy is permanent and that following the procedure she will not be able to become  pregnant or bear children.  We have discussed the following risk factors specifically and the patient has also been informed that additional complications not mentioned may develop:  Damage to bowel, bladder, ureters or to other internal organs, bleeding, infection and the risk from anesthesia.  We have discussed the procedure itself in detail and she has an informed understanding of this surgery.  We have also discussed the recovery period in which physical and sexual activity will be restricted for a varying degree of time, often 3 - 6 weeks. The Laparoscopic Portion of Hysterectomy has also been reviewed with the patient.  She understands how the laparoscope facilitates the procedure.  We have discussed the abdominal incisions and punctures that will be used.  We have also reviewed the increased Operating Room time often accompanying LAVH.  The slightly increased risk of complications secondary to abdominal punctures, and use of laparoscopic instrumentation has also been discussed in detail.I have answered all of her questions and I believe the patient has an informed understanding of the procedure of Laparoscopic Assisted Vaginal  Hysterectomy. Additional risks attendant with uterine fibroids and IDDM discussed.   I spent 35 minutes involved in the care of this patient preparing to see the patient by obtaining and reviewing her medical history (including labs, imaging tests and prior procedures), documenting clinical information in the electronic health record (EHR), counseling and coordinating care plans, writing and sending prescriptions, ordering tests or procedures and in direct communicating with the patient and medical staff discussing pertinent items from her history and physical exam.  Finis Bud, M.D. 10/24/2020 10:45 AM

## 2020-11-06 ENCOUNTER — Other Ambulatory Visit: Payer: 59

## 2020-11-06 ENCOUNTER — Other Ambulatory Visit: Payer: Self-pay

## 2020-11-06 ENCOUNTER — Encounter
Admission: RE | Admit: 2020-11-06 | Discharge: 2020-11-06 | Disposition: A | Discharge status: Home / Self Care | Payer: 59 | Source: Ambulatory Visit | Attending: Obstetrics and Gynecology | Admitting: Obstetrics and Gynecology

## 2020-11-06 VITALS — Ht 62.0 in | Wt 150.0 lb

## 2020-11-06 DIAGNOSIS — Z01818 Encounter for other preprocedural examination: Secondary | ICD-10-CM | POA: Insufficient documentation

## 2020-11-06 DIAGNOSIS — E1065 Type 1 diabetes mellitus with hyperglycemia: Secondary | ICD-10-CM | POA: Insufficient documentation

## 2020-11-06 HISTORY — DX: Inflammatory liver disease, unspecified: K75.9

## 2020-11-06 NOTE — Patient Instructions (Addendum)
Your procedure is scheduled on: @ADMITDT2 @ Su procedimiento est programado para: Report to [place] a: To find out your arrival time please call [phone] between [place] on  Para saber su hora de llegada por favor llame al ([place]: Friday 11/07/20   Remember: Instructions that are not followed completely may result in serious medical risk, up to and including death,  or upon the discretion of your surgeon and anesthesiologist your surgery may need to be rescheduled.  Recuerde: Las instrucciones que no se siguen completamente Heritage manager en un riesgo de salud grave, incluyendo hasta  la [place] o a discrecin de su cirujano y Environmental health practitioner, su ciruga se puede posponer.   __X_ 1.Do not eat food after midnight the night before your procedure. No    gum chewing or hard candies. You may drink clear water up to 2 hours     before you are scheduled to arrive for your surgery- DO not drink clear     Liquids within 2 hours of the start of your surgery.        No coma nada despus de la medianoche de la noche anterior a su    procedimiento. No coma chicles ni caramelos duros. Puede beber agua limpia hasta 2 horas antes de la fecha   programada para su [place].              _X__ 2.Do Not Smoke or use e-cigarettes For 24 Hours Prior to Your Surgery.    Do not use any chewable tobacco products for at least 6   hours prior to surgery.    No fume ni use cigarrillos electrnicos durante las 24 horas previas    a su [place].  No use ningn producto de tabaco masticable durante   al menos 6 horas antes de la [place].     __X_ 3. No alcohol for 24 hours before or after surgery.    No tome alcohol durante las 24 horas antes ni despus de la [place].   ____4. Bring all medications with you on the day of surgery if instructed.    Lleve todos los medicamentos con usted el da de su ciruga si se le    ha indicado as.   ____ 5. Notify your  doctor if there is any change in your medical condition (cold,fever, infections).    Informe a su mdico si hay algn cambio en su condicin mdica  (resfriado, fiebre, infecciones).   Do not wear jewelry, make-up, hairpins, clips or nail polish.  No use joyas, maquillajes, pinzas/ganchos para el cabello ni esmalte de uas.  Do not wear lotions, powders, or perfumes. You may wear deodorant.  No use lociones, polvos o perfumes.  Puede usar desodorante.    Do not shave 48 hours prior to surgery. Men may shave face and neck.  No se afeite 48 horas antes de la [place].  Los hombres pueden [employer] cara  y el cuello.   Do not bring valuables to the hospital.   No lleve objetos [facility] is not responsible for any belongings or valuables.  [place] no se hace responsable de ningn tipo de pertenencias u objetos de Geographical information systems officer.               Contacts, dentures or bridgework may not be worn into surgery.  Los lentes de contacto, las dentaduras postizas o puentes no se pueden usar en la  ciruga.   Leave your suitcase in the car. After surgery it may be brought to your room.  Deje su maleta en el auto.  Despus de la ciruga podr traerla a su habitacin.   For patients admitted to the hospital, discharge time is determined by your  treatment team.  Para los pacientes que sean ingresados al hospital, el tiempo en el cual se le  dar de alta es determinado por su equipo de [place].   Patients discharged the day of surgery will not be allowed to drive home. A los pacientes que se les da de alta el mismo da de la ciruga no se les permitir conducir a [place] representative.   Please read over the following fact sheets that you were given: Por favor [place] informacin que le dieron:     ____ Take these medicines the morning of surgery with A SIP OF WATER:          [employer] medicinas la maana de la ciruga con UN SORBO DE AGUA:  1. Ninguna  2.   3.   4.        5.  6.   ____ Use CHG Soap as directed          Utilice el jabn de CHG segn lo indicado  ____ Take 1/2 of usual insulin dose the night before surgery and none on the morning of surgery           Tome la mitad de la dosis habitual de insulina la noche antes de la [place] y no tome nada en la maana de la             ciruga  ____ Stop Anti-inflammatories on Este dia          Deje de tomar antiinflamatorios el da:   ____ Stop supplements until after surgery            Deje de tomar suplementos hasta despus de la ciruga    Your procedure is scheduled on: Monday 11/10/20 Report to the Registration Desk on the 1st floor of the [facility]. To find out your arrival time, please call [phone] between 1PM - 3PM on: Friday 11/07/20  REMEMBER: Instructions that are not followed completely may result in serious medical risk, up to and including death; or upon the discretion of your surgeon and anesthesiologist your surgery may need to be rescheduled.  Do not eat food after midnight the night before surgery.  No gum chewing, lozengers or hard candies.  You may however, drink water up to 2 hours before you are scheduled to arrive for your surgery. Do not drink anything within 2 hours of your scheduled arrival time.  Type 1 and Type 2 diabetics should only drink water.  TAKE THESE MEDICATIONS THE MORNING OF SURGERY WITH A SIP OF WATER: NONE  Take your regular does of insulin regular (NOVOLIN R,HUMULIN R) 100 units/mL injection on Wednesday 11/09/20 but only half of your insulin glargine (LANTUS) 100 UNIT/ML Solostar Pen which is 20 units that night.  Stop Iron-Vitamin C (VITRON-C) 65-125 MG TABS on Thursday 11/06/20  One week prior to surgery: Stop Anti-inflammatories (NSAIDS) such as Advil, Aleve, Ibuprofen, Motrin, Naproxen, Naprosyn and Aspirin based products such as Excedrin, Goodys Powder, BC Powder. Stop ANY OVER THE COUNTER supplements until after surgery. You may  however, continue to take Tylenol if needed for pain up until the day of surgery.  No Alcohol for 24 hours before or  after surgery.  No Smoking including e-cigarettes for 24 hours prior to surgery.  No chewable tobacco products for at least 6 hours prior to surgery.  No nicotine patches on the day of surgery.  Do not use any "recreational" drugs for at least a week prior to your surgery.  Please be advised that the combination of cocaine and anesthesia may have negative outcomes, up to and including death. If you test positive for cocaine, your surgery will be cancelled.  On the morning of surgery brush your teeth with toothpaste and water, you may rinse your mouth with mouthwash if you wish. Do not swallow any toothpaste or mouthwash.  Use CHG Soap or wipes as directed on instruction sheet.  Do not wear jewelry, make-up, hairpins, clips or nail polish.  Do not wear lotions, powders, or perfumes.   Do not shave body from the neck down 48 hours prior to surgery just in case you cut yourself which could leave a site for infection.  Also, freshly shaved skin may become irritated if using the CHG soap.  Do not bring valuables to the hospital. [facility] is not responsible for any missing/lost belongings or valuables.   Notify your doctor if there is any change in your medical condition (cold, fever, infection).  Wear comfortable clothing (specific to your surgery type) to the hospital.  After surgery, you can help prevent lung complications by doing breathing exercises.  Take deep breaths and cough every 1-2 hours. When coughing or sneezing, hold a pillow firmly against your incision with both hands. This is called "splinting." Doing this helps protect your incision. It also decreases belly discomfort.  If you are being discharged the day of surgery, you will not be allowed to drive home. You will need a responsible adult (18 years or older) to drive you home and stay with you that  night.   If you are taking public transportation, you will need to have a responsible adult (18 years or older) with you. Please confirm with your physician that it is acceptable to use public transportation.   Please call the [place] Dept. at [phone] if you have any questions about these instructions.  Surgery Visitation Policy:  Patients undergoing a surgery or procedure may have one family member or support person with them as long as that person is not COVID-19 positive or experiencing its symptoms.  That person may remain in the waiting area during the procedure and may rotate out with other people.  Inpatient Visitation:    Visiting hours are 7 a.m. to 8 p.m. Up to two visitors ages 16+ are allowed at one time in a patient room. The visitors may rotate out with other people during the day. Visitors must check out when they leave, or other visitors will not be allowed. One designated support person may remain overnight. The visitor must pass COVID-19 screenings, use hand sanitizer when entering and exiting the patient's room and wear a mask at all times, including in the patient's room. Patients must also wear a mask when staff or their visitor are in the room. Masking is required regardless of vaccination status.

## 2020-11-07 ENCOUNTER — Encounter: Payer: Self-pay | Admitting: Urgent Care

## 2020-11-07 ENCOUNTER — Encounter
Admission: RE | Admit: 2020-11-07 | Discharge: 2020-11-07 | Disposition: A | Discharge status: Home / Self Care | Payer: 59 | Source: Ambulatory Visit | Attending: Obstetrics and Gynecology | Admitting: Obstetrics and Gynecology

## 2020-11-07 DIAGNOSIS — Z01818 Encounter for other preprocedural examination: Secondary | ICD-10-CM | POA: Diagnosis not present

## 2020-11-07 DIAGNOSIS — E1065 Type 1 diabetes mellitus with hyperglycemia: Secondary | ICD-10-CM

## 2020-11-07 LAB — BASIC METABOLIC PANEL
Anion gap: 8 (ref 5–15)
BUN: 14 mg/dL (ref 6–20)
CO2: 25 mmol/L (ref 22–32)
Calcium: 9.3 mg/dL (ref 8.9–10.3)
Chloride: 107 mmol/L (ref 98–111)
Creatinine, Ser: 0.58 mg/dL (ref 0.44–1.00)
GFR, Estimated: >60 mL/min (ref >60)
Glucose, Bld: 59 mg/dL — ABNORMAL LOW (ref 70–99)
Potassium: 3.2 mmol/L — ABNORMAL LOW (ref 3.5–5.1)
Sodium: 140 mmol/L (ref 135–145)

## 2020-11-07 NOTE — Progress Notes (Signed)
  [facility] Perioperative Services: Pre-Admission/Anesthesia Testing  Abnormal Lab Notification   Date: 11/07/20  Name: Caitlyn Vincent MRN:   [id number]  Re: Abnormal labs noted during PAT appointment   Notified:  Provider Name Provider Role Notification Mode  Harlin Heys, MD OB/GYN Routed and/or faxed via [place] and Notes:  ABNORMAL LAB VALUE(S): Lab Results  Component Value Date   K 3.2 (L) 11/07/2020   Caitlyn Vincent is scheduled for a LAPAROSCOPIC ASSISTED VAGINAL HYSTERECTOMY; REMOVAL OF DRUG DELIVERY IMPLANT ([place]) on 11/10/2020.  In review of her medication list, it is noted the patient is not currently taking any type of daily diuretic therapies.  We will send to primary attending surgeon for review and consideration of optimization.  The minimum cutoff for anesthesia to proceed safely is a K+ of 3.0 mmol/L.  Order placed for K+ to be rechecked on the day of surgery to ensure that level is appropriate to proceed safely with planned procedural/anesthetic course.  This is a Community education officer; no formal response is required.  Honor Loh, MSN, APRN, FNP-C, CEN [facility]  Peri-operative Services Nurse Practitioner Phone: [phone] Fax: [phone] 11/07/20 11:00 AM

## 2020-11-10 ENCOUNTER — Ambulatory Visit: Payer: 59 | Admitting: Urgent Care

## 2020-11-10 ENCOUNTER — Ambulatory Visit
Admission: RE | Admit: 2020-11-10 | Discharge: 2020-11-10 | Disposition: A | Discharge status: Home / Self Care | Payer: 59 | Attending: Obstetrics and Gynecology | Admitting: Obstetrics and Gynecology

## 2020-11-10 ENCOUNTER — Encounter: Payer: Self-pay | Admitting: Obstetrics and Gynecology

## 2020-11-10 ENCOUNTER — Encounter
Admission: RE | Disposition: A | Discharge status: Home / Self Care | Payer: Self-pay | Source: Home / Self Care | Attending: Obstetrics and Gynecology

## 2020-11-10 ENCOUNTER — Other Ambulatory Visit: Payer: Self-pay

## 2020-11-10 DIAGNOSIS — Z79899 Other long term (current) drug therapy: Secondary | ICD-10-CM | POA: Diagnosis not present

## 2020-11-10 DIAGNOSIS — E119 Type 2 diabetes mellitus without complications: Secondary | ICD-10-CM | POA: Diagnosis not present

## 2020-11-10 DIAGNOSIS — Z794 Long term (current) use of insulin: Secondary | ICD-10-CM | POA: Diagnosis not present

## 2020-11-10 DIAGNOSIS — Z3046 Encounter for surveillance of implantable subdermal contraceptive: Secondary | ICD-10-CM | POA: Diagnosis not present

## 2020-11-10 DIAGNOSIS — N920 Excessive and frequent menstruation with regular cycle: Secondary | ICD-10-CM | POA: Diagnosis not present

## 2020-11-10 DIAGNOSIS — D259 Leiomyoma of uterus, unspecified: Secondary | ICD-10-CM | POA: Diagnosis not present

## 2020-11-10 HISTORY — PX: LAPAROSCOPIC ASSISTED VAGINAL HYSTERECTOMY: SHX5398

## 2020-11-10 HISTORY — PX: REMOVAL OF DRUG DELIVERY IMPLANT: SHX6585

## 2020-11-10 LAB — POCT I-STAT, CHEM 8
BUN: 12 mg/dL (ref 6–20)
Calcium, Ion: 1.24 mmol/L (ref 1.15–1.40)
Chloride: 102 mmol/L (ref 98–111)
Creatinine, Ser: 0.6 mg/dL (ref 0.44–1.00)
Glucose, Bld: 278 mg/dL — ABNORMAL HIGH (ref 70–99)
HCT: 42 % (ref 36.0–46.0)
Hemoglobin: 14.3 g/dL (ref 12.0–15.0)
Potassium: 4.1 mmol/L (ref 3.5–5.1)
Sodium: 138 mmol/L (ref 135–145)
TCO2: 24 mmol/L (ref 22–32)

## 2020-11-10 LAB — POCT PREGNANCY, URINE: Preg Test, Ur: NEGATIVE

## 2020-11-10 LAB — GLUCOSE, CAPILLARY: Glucose-Capillary: 210 mg/dL — ABNORMAL HIGH (ref 70–99)

## 2020-11-10 SURGERY — HYSTERECTOMY, VAGINAL, LAPAROSCOPY-ASSISTED
Anesthesia: General

## 2020-11-10 MED ORDER — LIDOCAINE HCL (CARDIAC) PF 100 MG/5ML IV SOSY
PREFILLED_SYRINGE | INTRAVENOUS | Status: DC | PRN
  Administered 2020-11-10: 80 mg via INTRAVENOUS

## 2020-11-10 MED ORDER — DEXAMETHASONE SODIUM PHOSPHATE 10 MG/ML IJ SOLN
INTRAMUSCULAR | Status: AC
  Filled 2020-11-10: qty 1

## 2020-11-10 MED ORDER — DIPHENHYDRAMINE HCL 50 MG/ML IJ SOLN
INTRAMUSCULAR | Status: DC | PRN
  Administered 2020-11-10: 25 mg via INTRAVENOUS

## 2020-11-10 MED ORDER — FENTANYL CITRATE (PF) 100 MCG/2ML IJ SOLN
25.0000 ug | INTRAMUSCULAR | Status: DC | PRN
  Administered 2020-11-10 (×3): 25 ug via INTRAVENOUS

## 2020-11-10 MED ORDER — OXYCODONE HCL 5 MG PO TABS: 5.0000 mg | ORAL_TABLET | Freq: Once | ORAL | Status: DC | PRN

## 2020-11-10 MED ORDER — PROMETHAZINE HCL 25 MG/ML IJ SOLN
INTRAMUSCULAR | Status: AC
  Administered 2020-11-10: 6.25 mg via INTRAVENOUS
  Filled 2020-11-10: qty 1

## 2020-11-10 MED ORDER — CEFAZOLIN SODIUM-DEXTROSE 2-4 GM/100ML-% IV SOLN
INTRAVENOUS | Status: AC
  Filled 2020-11-10: qty 100

## 2020-11-10 MED ORDER — INSULIN ASPART 100 UNIT/ML IJ SOLN
INTRAMUSCULAR | Status: AC
  Administered 2020-11-10: 5 [IU] via SUBCUTANEOUS
  Filled 2020-11-10: qty 1

## 2020-11-10 MED ORDER — ACETAMINOPHEN 10 MG/ML IV SOLN
INTRAVENOUS | Status: AC
  Filled 2020-11-10: qty 100

## 2020-11-10 MED ORDER — DIPHENHYDRAMINE HCL 50 MG/ML IJ SOLN
INTRAMUSCULAR | Status: AC
  Filled 2020-11-10: qty 1

## 2020-11-10 MED ORDER — SUGAMMADEX SODIUM 200 MG/2ML IV SOLN
INTRAVENOUS | Status: DC | PRN
  Administered 2020-11-10: 160 mg via INTRAVENOUS

## 2020-11-10 MED ORDER — FENTANYL CITRATE (PF) 100 MCG/2ML IJ SOLN
INTRAMUSCULAR | Status: AC
  Administered 2020-11-10: 25 ug via INTRAVENOUS
  Filled 2020-11-10: qty 2

## 2020-11-10 MED ORDER — LIDOCAINE HCL (PF) 2 % IJ SOLN
INTRAMUSCULAR | Status: AC
  Filled 2020-11-10: qty 5

## 2020-11-10 MED ORDER — FAMOTIDINE 20 MG PO TABS
ORAL_TABLET | ORAL | Status: AC
  Administered 2020-11-10: 20 mg via ORAL
  Filled 2020-11-10: qty 1

## 2020-11-10 MED ORDER — MIDAZOLAM HCL 2 MG/2ML IJ SOLN
INTRAMUSCULAR | Status: DC | PRN
  Administered 2020-11-10: 2 mg via INTRAVENOUS

## 2020-11-10 MED ORDER — ROCURONIUM BROMIDE 100 MG/10ML IV SOLN
INTRAVENOUS | Status: DC | PRN
  Administered 2020-11-10: 10 mg via INTRAVENOUS
  Administered 2020-11-10: 50 mg via INTRAVENOUS
  Administered 2020-11-10: 10 mg via INTRAVENOUS

## 2020-11-10 MED ORDER — POVIDONE-IODINE 10 % EX SWAB
2.0000 "application " | Freq: Once | CUTANEOUS | Status: AC
  Administered 2020-11-10: 2 via TOPICAL

## 2020-11-10 MED ORDER — IBUPROFEN 600 MG PO TABS: 600.0000 mg | ORAL_TABLET | Freq: Three times a day (TID) | ORAL | 1 refills | Status: DC

## 2020-11-10 MED ORDER — OXYCODONE HCL 5 MG/5ML PO SOLN
5.0000 mg | Freq: Once | ORAL | Status: DC | PRN
Start: 2020-11-10 — End: 2020-11-10

## 2020-11-10 MED ORDER — FENTANYL CITRATE (PF) 100 MCG/2ML IJ SOLN
INTRAMUSCULAR | Status: AC
  Filled 2020-11-10: qty 2

## 2020-11-10 MED ORDER — MIDAZOLAM HCL 2 MG/2ML IJ SOLN
INTRAMUSCULAR | Status: AC
  Filled 2020-11-10: qty 2

## 2020-11-10 MED ORDER — INSULIN ASPART 100 UNIT/ML IJ SOLN: 5.0000 [IU] | Freq: Once | INTRAMUSCULAR | Status: AC

## 2020-11-10 MED ORDER — FAMOTIDINE 20 MG PO TABS: 20.0000 mg | ORAL_TABLET | Freq: Once | ORAL | Status: AC

## 2020-11-10 MED ORDER — RINGERS IRRIGATION IR SOLN
Status: DC | PRN
  Administered 2020-11-10: 300 mL

## 2020-11-10 MED ORDER — FENTANYL CITRATE (PF) 100 MCG/2ML IJ SOLN
INTRAMUSCULAR | Status: DC | PRN
  Administered 2020-11-10 (×3): 50 ug via INTRAVENOUS

## 2020-11-10 MED ORDER — 0.9 % SODIUM CHLORIDE (POUR BTL) OPTIME
TOPICAL | Status: DC | PRN
  Administered 2020-11-10: 1000 mL

## 2020-11-10 MED ORDER — PROMETHAZINE HCL 25 MG/ML IJ SOLN: 6.2500 mg | Freq: Once | INTRAMUSCULAR | Status: AC

## 2020-11-10 MED ORDER — IBUPROFEN 600 MG PO TABS
ORAL_TABLET | ORAL | Status: AC
  Administered 2020-11-10: 600 mg
  Filled 2020-11-10: qty 1

## 2020-11-10 MED ORDER — SODIUM CHLORIDE (PF) 0.9 % IJ SOLN
INTRAMUSCULAR | Status: AC
  Filled 2020-11-10: qty 50

## 2020-11-10 MED ORDER — ONDANSETRON HCL 4 MG/2ML IJ SOLN
INTRAMUSCULAR | Status: DC | PRN
  Administered 2020-11-10: 4 mg via INTRAVENOUS

## 2020-11-10 MED ORDER — ACETAMINOPHEN 10 MG/ML IV SOLN
INTRAVENOUS | Status: DC | PRN
  Administered 2020-11-10: 1000 mg via INTRAVENOUS

## 2020-11-10 MED ORDER — IBUPROFEN 600 MG PO TABS
600.0000 mg | ORAL_TABLET | Freq: Three times a day (TID) | ORAL | Status: DC
  Filled 2020-11-10: qty 1

## 2020-11-10 MED ORDER — HYDROCODONE-ACETAMINOPHEN 5-325 MG PO TABS: 1.0000 | ORAL_TABLET | Freq: Four times a day (QID) | ORAL | 0 refills | Status: DC | PRN

## 2020-11-10 MED ORDER — ORAL CARE MOUTH RINSE: 15.0000 mL | Freq: Once | OROMUCOSAL | Status: AC

## 2020-11-10 MED ORDER — ROCURONIUM BROMIDE 10 MG/ML (PF) SYRINGE
PREFILLED_SYRINGE | INTRAVENOUS | Status: AC
  Filled 2020-11-10: qty 10

## 2020-11-10 MED ORDER — BUPIVACAINE HCL (PF) 0.5 % IJ SOLN
INTRAMUSCULAR | Status: AC
  Filled 2020-11-10: qty 30

## 2020-11-10 MED ORDER — PROPOFOL 10 MG/ML IV BOLUS
INTRAVENOUS | Status: DC | PRN
  Administered 2020-11-10: 150 mg via INTRAVENOUS

## 2020-11-10 MED ORDER — VASOPRESSIN 20 UNIT/ML IV SOLN
INTRAVENOUS | Status: AC
  Filled 2020-11-10: qty 1

## 2020-11-10 MED ORDER — VASOPRESSIN 20 UNIT/ML IV SOLN
INTRAVENOUS | Status: DC | PRN
  Administered 2020-11-10: 12 mL via INTRAMUSCULAR

## 2020-11-10 MED ORDER — CEFAZOLIN SODIUM-DEXTROSE 2-4 GM/100ML-% IV SOLN
2.0000 g | Freq: Once | INTRAVENOUS | Status: AC
  Administered 2020-11-10: 2 g via INTRAVENOUS

## 2020-11-10 MED ORDER — PROPOFOL 10 MG/ML IV BOLUS
INTRAVENOUS | Status: AC
  Filled 2020-11-10: qty 40

## 2020-11-10 MED ORDER — CHLORHEXIDINE GLUCONATE 0.12 % MT SOLN
15.0000 mL | Freq: Once | OROMUCOSAL | Status: AC
Start: 2020-11-10 — End: 2020-11-10

## 2020-11-10 MED ORDER — SODIUM CHLORIDE (PF) 0.9 % IJ SOLN
INTRAMUSCULAR | Status: AC
  Filled 2020-11-10: qty 10

## 2020-11-10 MED ORDER — ONDANSETRON HCL 4 MG/2ML IJ SOLN
INTRAMUSCULAR | Status: AC
  Filled 2020-11-10: qty 2

## 2020-11-10 MED ORDER — CHLORHEXIDINE GLUCONATE 0.12 % MT SOLN
OROMUCOSAL | Status: AC
  Administered 2020-11-10: 15 mL via OROMUCOSAL
  Filled 2020-11-10: qty 15

## 2020-11-10 MED ORDER — SODIUM CHLORIDE 0.9 % IV SOLN: INTRAVENOUS | Status: DC

## 2020-11-10 SURGICAL SUPPLY — 51 items
ADH LQ OCL WTPRF AMP STRL LF (MISCELLANEOUS)
ADH SKN CLS APL DERMABOND .7 (GAUZE/BANDAGES/DRESSINGS) ×1
ADHESIVE MASTISOL STRL (MISCELLANEOUS) IMPLANT
APL PRP STRL LF DISP 70% ISPRP (MISCELLANEOUS)
BAG DRN RND TRDRP ANRFLXCHMBR (UROLOGICAL SUPPLIES) ×1
BAG URINE DRAIN 2000ML AR STRL (UROLOGICAL SUPPLIES) ×2 IMPLANT
BLADE SURG SZ11 CARB STEEL (BLADE) ×2 IMPLANT
CATH FOLEY 2WAY  5CC 16FR (CATHETERS) ×1
CATH FOLEY 2WAY 5CC 16FR (CATHETERS) ×1
CATH URTH 16FR FL 2W BLN LF (CATHETERS) ×1 IMPLANT
CHLORAPREP W/TINT 26 (MISCELLANEOUS) IMPLANT
DEFOGGER SCOPE WARMER CLEARIFY (MISCELLANEOUS) IMPLANT
DERMABOND ADVANCED (GAUZE/BANDAGES/DRESSINGS) ×1
DERMABOND ADVANCED .7 DNX12 (GAUZE/BANDAGES/DRESSINGS) ×1 IMPLANT
ELECT REM PT RETURN 9FT ADLT (ELECTROSURGICAL) ×2
ELECTRODE REM PT RTRN 9FT ADLT (ELECTROSURGICAL) ×1 IMPLANT
GAUZE 4X4 16PLY ~~LOC~~+RFID DBL (SPONGE) ×6 IMPLANT
GLOVE SURG ENC MOIS LTX SZ8 (GLOVE) ×6 IMPLANT
GLOVE SURG POLY ORTHO LF SZ7.5 (GLOVE) ×6 IMPLANT
GOWN STRL REUS W/ TWL LRG LVL3 (GOWN DISPOSABLE) ×4 IMPLANT
GOWN STRL REUS W/ TWL XL LVL3 (GOWN DISPOSABLE) ×2 IMPLANT
GOWN STRL REUS W/TWL LRG LVL3 (GOWN DISPOSABLE) ×8
GOWN STRL REUS W/TWL XL LVL3 (GOWN DISPOSABLE) ×4
HANDLE YANKAUER SUCT BULB TIP (MISCELLANEOUS) ×2 IMPLANT
IRRIGATION STRYKERFLOW (MISCELLANEOUS) ×1 IMPLANT
IRRIGATOR STRYKERFLOW (MISCELLANEOUS) ×2
IV LACTATED RINGERS 1000ML (IV SOLUTION) ×2 IMPLANT
KIT PINK PAD W/HEAD ARE REST (MISCELLANEOUS) ×2
KIT PINK PAD W/HEAD ARM REST (MISCELLANEOUS) ×1 IMPLANT
KIT TURNOVER CYSTO (KITS) IMPLANT
LIGASURE LAP MARYLAND 5MM 37CM (ELECTROSURGICAL) ×2 IMPLANT
MANIFOLD NEPTUNE II (INSTRUMENTS) ×2 IMPLANT
PACK BASIN MINOR ARMC (MISCELLANEOUS) ×2 IMPLANT
PACK GYN LAPAROSCOPIC (MISCELLANEOUS) ×2 IMPLANT
PAD OB MATERNITY 4.3X12.25 (PERSONAL CARE ITEMS) IMPLANT
PAD PREP 24X41 OB/GYN DISP (PERSONAL CARE ITEMS) ×2 IMPLANT
RETRACTOR PHONTONGUIDE ADAPT (ADAPTER) IMPLANT
SCRUB EXIDINE 4% CHG 4OZ (MISCELLANEOUS) ×2 IMPLANT
SLEEVE ENDOPATH XCEL 5M (ENDOMECHANICALS) ×4 IMPLANT
SOLUTION ELECTROLUBE (MISCELLANEOUS) ×2 IMPLANT
SPONGE T-LAP 18X18 ~~LOC~~+RFID (SPONGE) IMPLANT
STRIP CLOSURE SKIN 1/2X4 (GAUZE/BANDAGES/DRESSINGS) IMPLANT
SUT VIC AB 0 CT1 27 (SUTURE) ×4
SUT VIC AB 0 CT1 27XCR 8 STRN (SUTURE) ×2 IMPLANT
SUT VIC AB 0 CT1 36 (SUTURE) ×2 IMPLANT
SUT VIC AB 4-0 FS2 27 (SUTURE) ×2 IMPLANT
SYR 10ML LL (SYRINGE) ×2 IMPLANT
TAPE TRANSPORE STRL 2 31045 (GAUZE/BANDAGES/DRESSINGS) ×2 IMPLANT
TROCAR XCEL NON-BLD 5MMX100MML (ENDOMECHANICALS) ×2 IMPLANT
TUBING EVAC SMOKE HEATED PNEUM (TUBING) ×2 IMPLANT
WATER STERILE IRR 500ML POUR (IV SOLUTION) IMPLANT

## 2020-11-10 NOTE — Discharge Instructions (Addendum)
AMBULATORY SURGERY  DISCHARGE INSTRUCTIONS   The drugs that you were given will stay in your system until tomorrow so for the next 24 hours you should not:  Drive an automobile Make any legal decisions Drink any alcoholic beverage   You may resume regular meals tomorrow.  Today it is better to start with liquids and gradually work up to solid foods.  You may eat anything you prefer, but it is better to start with liquids, then soup and crackers, and gradually work up to solid foods.   Please notify your doctor immediately if you have any unusual bleeding, trouble breathing, redness and pain at the surgery site, drainage, fever, or pain not relieved by medication.     Your post-operative visit with Dr.                                       is: Date:                        Time:    Please call to schedule your post-operative visit.  Additional Instructions:      CIRUGIA AMBULATORIA       Instruccionnes de a  1.  Las drogas que se Statistician en su cuerpo [employer], asi            que por las proximas 24 horas usted no debe:   Conducir Scientist, research (medical)) un automovil   Hacer ninguna decision legal   Tomar ninguna bebida alcoholica  2.  A) Manana puede comenzar una dieta regular.  Es mejor que hoy empiece con           liquidos y gradualmente anada comidas solidas.       B) Puede comer cualquier comida que desee pero es mejor empezar con liquidos,                      luego sopitas con galletas saladas y gradualmente llegar a las comidas solidas.  3.  Por favor avise a su medico inmediatamente si usted tiene algun sangrado anormal,       tiene dificultad con la respiracion, enrojecimiento y Social research officer, government en el sitio de la cirugia, [place],       fiebro o dolor que se alivia con [place].

## 2020-11-10 NOTE — Transfer of Care (Signed)
Immediate Anesthesia Transfer of Care Note  Patient: Caitlyn Vincent  Procedure(s) Performed: LAPAROSCOPIC ASSISTED VAGINAL HYSTERECTOMY REMOVAL OF DRUG DELIVERY IMPLANT (NEXPLANON REMOVAL)  Patient Location: PACU  Anesthesia Type:General  Level of Consciousness: awake and alert   Airway & Oxygen Therapy: Patient Spontanous Breathing and Patient connected to face mask oxygen  Post-op Assessment: Report given to RN and Post -op Vital signs reviewed and stable  Post vital signs: Reviewed and stable  Last Vitals:  Vitals Value Taken Time  BP 148/83 11/10/20 1009  Temp    Pulse 63 11/10/20 1012  Resp 14 11/10/20 1012  SpO2 100 % 11/10/20 1012  Vitals shown include unvalidated device data.  Last Pain:  Vitals:   11/10/20 0646  TempSrc: Oral  PainSc: 0-No pain         Complications: No notable events documented.

## 2020-11-10 NOTE — Anesthesia Preprocedure Evaluation (Signed)
Anesthesia Evaluation  Patient identified by MRN, date of birth, ID band Patient awake    Reviewed: Allergy & Precautions, NPO status , Patient's Chart, lab work & pertinent test results  History of Anesthesia Complications Negative for: history of anesthetic complications  Airway Mallampati: III  TM Distance: <3 FB Neck ROM: full    Dental  (+) Chipped   Pulmonary neg pulmonary ROS, neg shortness of breath,    Pulmonary exam normal        Cardiovascular Exercise Tolerance: Good (-) angina(-) Past MI and (-) DOE negative cardio ROS Normal cardiovascular exam     Neuro/Psych negative neurological ROS  negative psych ROS   GI/Hepatic negative GI ROS, (+) Hepatitis -  Endo/Other  diabetes, Type 2, Insulin Dependent  Renal/GU      Musculoskeletal   Abdominal   Peds  Hematology negative hematology ROS (+)   Anesthesia Other Findings Past Medical History: No date: Anemia No date: Diabetes mellitus without complication (HCC)     Comment:  Type II No date: Hepatitis No date: Hyperlipidemia No date: Vaginal bleeding  Past Surgical History: No date: CHOLECYSTECTOMY  BMI    Body Mass Index: 27.42 kg/m      Reproductive/Obstetrics negative OB ROS                             Anesthesia Physical Anesthesia Plan  ASA: 3  Anesthesia Plan: General   Post-op Pain Management:    Induction: Intravenous  PONV Risk Score and Plan: Ondansetron, Dexamethasone, Midazolam and Treatment may vary due to age or medical condition  Airway Management Planned: Natural Airway and Nasal Cannula  Additional Equipment:   Intra-op Plan:   Post-operative Plan:   Informed Consent: I have reviewed the patients History and Physical, chart, labs and discussed the procedure including the risks, benefits and alternatives for the proposed anesthesia with the patient or authorized representative who has  indicated his/her understanding and acceptance.     Dental Advisory Given and Interpreter used for interveiw  Plan Discussed with: Anesthesiologist, CRNA and Surgeon  Anesthesia Plan Comments: (Patient consented for risks of anesthesia including but not limited to:  - adverse reactions to medications - risk of airway placement if required - damage to eyes, teeth, lips or other oral mucosa - nerve damage due to positioning  - sore throat or hoarseness - Damage to heart, brain, nerves, lungs, other parts of body or loss of life  Patient voiced understanding.)        Anesthesia Quick Evaluation

## 2020-11-10 NOTE — Anesthesia Postprocedure Evaluation (Signed)
Anesthesia Post Note  Patient: Caitlyn Vincent  Procedure(s) Performed: LAPAROSCOPIC ASSISTED VAGINAL HYSTERECTOMY REMOVAL OF DRUG DELIVERY IMPLANT (NEXPLANON REMOVAL)  Patient location during evaluation: PACU Anesthesia Type: General Level of consciousness: awake and alert and oriented Pain management: pain level controlled Vital Signs Assessment: post-procedure vital signs reviewed and stable Respiratory status: spontaneous breathing, nonlabored ventilation and respiratory function stable Cardiovascular status: blood pressure returned to baseline and stable Postop Assessment: no signs of nausea or vomiting Anesthetic complications: no   No notable events documented.   Last Vitals:  Vitals:   11/10/20 1116 11/10/20 1130  BP: 132/65 109/72  Pulse: 64 63  Resp: 14 11  Temp:    SpO2: 99% 99%    Last Pain:  Vitals:   11/10/20 1130  TempSrc:   PainSc: 3                  Tayden Nichelson

## 2020-11-10 NOTE — Progress Notes (Signed)
Blood sugar of 278 reported to Dr. Amie Critchley. Verbal order to give the patient 5 units of novalog received

## 2020-11-10 NOTE — Anesthesia Procedure Notes (Signed)
Procedure Name: Intubation Date/Time: 11/10/2020 7:40 AM Performed by: Fredderick Phenix, CRNA Pre-anesthesia Checklist: Patient identified, Emergency Drugs available, Suction available and Patient being monitored Patient Re-evaluated:Patient Re-evaluated prior to induction Oxygen Delivery Method: Circle system utilized Preoxygenation: Pre-oxygenation with 100% oxygen Induction Type: IV induction Ventilation: Mask ventilation without difficulty Laryngoscope Size: Mac and 4 Grade View: Grade I Tube type: Oral Tube size: 7.0 mm Number of attempts: 1 Airway Equipment and Method: Stylet and Oral airway Placement Confirmation: ETT inserted through vocal cords under direct vision, positive ETCO2 and breath sounds checked- equal and bilateral Secured at: 20 cm Tube secured with: Tape Dental Injury: Teeth and Oropharynx as per pre-operative assessment

## 2020-11-10 NOTE — Op Note (Signed)
OPERATIVE NOTE 11/10/2020 10:19 AM  PRE-OPERATIVE DIAGNOSIS:  1) menorrhagia, uterine fibroid  POST-OPERATIVE DIAGNOSIS:  * No Diagnosis Codes entered *  OPERATION: Procedure(s) (LRB): LAPAROSCOPIC ASSISTED VAGINAL HYSTERECTOMY (N/A) REMOVAL OF DRUG DELIVERY IMPLANT (NEXPLANON REMOVAL) (N/A)    SURGEON(S): Surgeon(s) and Role:    Harlin Heys, MD - Primary     Adrienne - CNFA  No other capable assistant was available for this surgery which requires an experienced, high level assistant.  She provided exposure, dissection, suctioning, retraction, and general support and assistance during the procedure.   ANESTHESIA: General  ESTIMATED BLOOD LOSS: 100 mL  OPERATIVE FINDINGS: multiple uterine fibroids  SPECIMEN:  ID Type Source Tests Collected by Time Destination  1 : Uterus, Cervix, bilateral tubes  GYN Uterus, Cervix, and Bilateral Fallopian Tubes SURGICAL PATHOLOGY Harlin Heys, MD [id number]     COMPLICATIONS: None  DRAINS: Foley to gravity  DISPOSITION: Stable to recovery room  DESCRIPTION OF PROCEDURE:      The patient was prepped and draped in the dorsolithotomy position and placed under general anesthesia. The bladder was emptied. The cervix was grasped with a multi-toothed tenaculum and a uterine manipulator was placed within the cervical os respecting the position and curvature of the uterus. After changing gloves we proceeded abdominally. A small infraumbilical incision was made and a 5 mm trocar port was placed within the abdominopelvic cavity. The opening pressure was less than 7 mmHg.  Approximately 3 and 1/2 L of carbon dioxide gas was instilled within the abdominal pelvic cavity. The laparoscope was placed and the pelvis and abdomen were carefully inspected. In the usual manner, under direct visualization right and left lower quadrant ports of 5 mm size were placed. Both ureters were identified in the pelvis prior to dissection or clamping  and cutting of pedicles. The fallopian tubes were elevated and the mesenteric side systematically coagulated and divided allowing the tube to be removed at the time of uterine removal. The round ligaments were coagulated and divided and a bladder flap was created. The upper aspect of the broad ligament was clamped coagulated and divided. The uterine arteries were skeletonized, triply coagulated and divided. Careful inspection of all pedicles and the remainder of the pelvis was performed. Hemostasis was noted. The lower quadrant ports were removed, hemostasis of the port sites was noted, and the incisions were closed in subcuticular manner. The laparoscope and trocar sleeve were removed from the infraumbilical incision, hemostasis was noted, and the incision was closed in a subcuticular manner. A long-acting anesthetic was employed in the skin incisions. We then proceeded vaginally. A weighted speculum was placed posteriorly. A multi-toothed tenaculum was used to grasp the cervix and the cervix was injected in a circumferential manner with a dilute Pitressin solution. An incision was made around the cervix and the vaginal mucosa was dissected off of the cervix. The posterior cul-de-sac was identified and entered and the weighted speculum was placed within this. The anterior cul-de-sac was identified and entered and a retractor was placed and used to retract the bladder anteriorly keeping it out of the operative field. The uterosacral ligaments were clamped divided and suture ligated. The cardinal ligaments were clamped divided and suture ligated. The small remaining pedicle was clamped divided and suture ligated bilaterally allowing delivery of the specimen. Angle sutures were placed in the usual manner. A culdoplasty was performed. The peritoneum was identified anteriorly and then incorporating the left upper pedicle left lower pedicle right lower  pedicle right upper pedicle and anterior peritoneum a  pursestring suture was placed exteriorizing all pedicles. Hemostasis of all pedicles was noted at this time. The vaginal mucosa was then closed with a running suture of Vicryl. The nexplanon was identified in the left arm.  A small incision was made.  The capsule was identified and grasped.  The capsule was opened and the implant removed.  Hemostasis was noted. Dermabond was applied. The patient went to recovery room in stable condition. Clear urine was noted at the end of the procedure when the bladder was emptied.  Finis Bud, M.D. 11/10/2020 10:19 AM

## 2020-11-10 NOTE — Interval H&P Note (Signed)
History and Physical Interval Note:  11/10/2020 7:13 AM  Caitlyn Vincent  has presented today for surgery, with the diagnosis of menorrhagia, uterine fibroid.  The various methods of treatment have been discussed with the patient and family. After consideration of risks, benefits and other options for treatment, the patient has consented to  Procedure(s): LAPAROSCOPIC ASSISTED VAGINAL HYSTERECTOMY (N/A) REMOVAL OF DRUG DELIVERY IMPLANT ([place]) (N/A) as a surgical intervention.  The patient's history has been reviewed, patient examined, no change in status, stable for surgery.  I have reviewed the patient's chart and labs.  Questions were answered to the patient's satisfaction.     Jeannie Fend

## 2020-11-11 ENCOUNTER — Encounter: Payer: Self-pay | Admitting: Obstetrics and Gynecology

## 2020-11-11 LAB — SURGICAL PATHOLOGY

## 2020-11-11 LAB — ABO/RH: ABO/RH(D): O POS

## 2020-11-12 LAB — TYPE AND SCREEN
ABO/RH(D): O POS
Antibody Screen: POSITIVE
Extend sample reason: TRANSFUSED
Unit division: 0
Unit division: 0

## 2020-11-12 LAB — BPAM RBC
Blood Product Expiration Date: 202211182359
Blood Product Expiration Date: 202211272359
Unit Type and Rh: 5100
Unit Type and Rh: 5100

## 2020-11-19 ENCOUNTER — Other Ambulatory Visit: Payer: Self-pay

## 2020-11-19 ENCOUNTER — Ambulatory Visit (INDEPENDENT_AMBULATORY_CARE_PROVIDER_SITE_OTHER): Payer: 59 | Admitting: Obstetrics and Gynecology

## 2020-11-19 ENCOUNTER — Encounter: Payer: Self-pay | Admitting: Obstetrics and Gynecology

## 2020-11-19 VITALS — BP 122/64 | HR 95 | Ht 62.0 in | Wt 152.6 lb

## 2020-11-19 DIAGNOSIS — R399 Unspecified symptoms and signs involving the genitourinary system: Secondary | ICD-10-CM | POA: Diagnosis not present

## 2020-11-19 DIAGNOSIS — Z9889 Other specified postprocedural states: Secondary | ICD-10-CM

## 2020-11-19 LAB — POCT URINALYSIS DIPSTICK
Bilirubin, UA: NEGATIVE
Blood, UA: NEGATIVE
Glucose, UA: POSITIVE — AB
Ketones, UA: NEGATIVE
Leukocytes, UA: NEGATIVE
Nitrite, UA: NEGATIVE
Protein, UA: NEGATIVE
Spec Grav, UA: 1.02 (ref 1.010–1.025)
Urobilinogen, UA: 0.2 E.U./dL
pH, UA: 6 (ref 5.0–8.0)

## 2020-11-19 MED ORDER — NITROFURANTOIN MONOHYD MACRO 100 MG PO CAPS: 100.0000 mg | ORAL_CAPSULE | Freq: Two times a day (BID) | ORAL | 0 refills | Status: DC

## 2020-11-19 NOTE — Progress Notes (Signed)
HPI:      [name] is a 40 y.o. [phone] who LMP was No LMP recorded (lmp unknown). Patient has had a hysterectomy.  Subjective:   She presents today approximately 1 week from LAVH.  She is generally doing well.  She reports some pain on her left side over the right side and that her incisions itch sometimes.  She also complains of some midline abdominal pain.  She reports a very small amount of vaginal spotting. She is having bowel movements and urinating generally without problem.    Hx: The following portions of the patient's history were reviewed and updated as appropriate:             She  has a past medical history of Anemia, Diabetes mellitus without complication ([place]), Hepatitis, Hyperlipidemia, and Vaginal bleeding. She does not have any pertinent problems on file. She  has a past surgical history that includes Cholecystectomy; Laparoscopic assisted vaginal hysterectomy (N/A, 11/10/2020); and Removal of drug delivery implant (N/A, 11/10/2020). Her family history includes Healthy in her father and mother. She  reports that she has never smoked. She has never used smokeless tobacco. She reports that she does not currently use alcohol. She reports that she does not use drugs. She has a current medication list which includes the following prescription(s): ibuprofen, insulin glargine, insulin regular, iron-vitamin c, nitrofurantoin (macrocrystal-monohydrate), and oxycodone-acetaminophen. She has No Known Allergies.       Review of Systems:  Review of Systems  Constitutional: Denied constitutional symptoms, night sweats, recent illness, fatigue, fever, insomnia and weight loss.  Eyes: Denied eye symptoms, eye pain, photophobia, vision change and visual disturbance.  Ears/Nose/Throat/Neck: Denied ear, nose, throat or neck symptoms, hearing loss, nasal discharge, sinus congestion and sore throat.  Cardiovascular: Denied cardiovascular symptoms, arrhythmia, chest  pain/pressure, edema, exercise intolerance, orthopnea and palpitations.  Respiratory: Denied pulmonary symptoms, asthma, pleuritic pain, productive sputum, cough, dyspnea and wheezing.  Gastrointestinal: Denied, gastro-esophageal reflux, melena, nausea and vomiting.  Genitourinary: See HPI for additional information.  Musculoskeletal: Denied musculoskeletal symptoms, stiffness, swelling, muscle weakness and myalgia.  Dermatologic: Denied dermatology symptoms, rash and scar.  Neurologic: Denied neurology symptoms, dizziness, headache, neck pain and syncope.  Psychiatric: Denied psychiatric symptoms, anxiety and depression.  Endocrine: Denied endocrine symptoms including hot flashes and night sweats.   Meds:   Current Outpatient Medications on File Prior to Visit  Medication Sig Dispense Refill   ibuprofen (ADVIL) 600 MG tablet Take 1 tablet (600 mg total) by mouth 3 (three) times daily. 50 tablet 1   insulin glargine (LANTUS) 100 UNIT/ML Solostar Pen Inject 40 Units into the skin at bedtime.     insulin regular (NOVOLIN R,HUMULIN R) 100 units/mL injection Inject 30 Units into the skin 3 (three) times daily before meals.     Iron-Vitamin C (VITRON-C) 65-125 MG TABS Take 1 tablet by mouth 2 (two) times daily. 60 tablet 1   OXYCODONE-ACETAMINOPHEN PO Take by mouth.     No current facility-administered medications on file prior to visit.      Objective:     Vitals:   11/19/20 1418  BP: 122/64  Pulse: 95   Filed Weights   11/19/20 1418  Weight: 152 lb 9.6 oz (69.2 kg)               Abdomen: Soft.  Non-tender.  No masses.  No HSM.  Incision/s: Intact.  Healing well.  No erythema.  No drainage.  Assessment:    G2P2002 Patient Active Problem List   Diagnosis Date Noted   Iron deficiency anemia 03/28/2018   Poorly controlled type 1 diabetes mellitus ([place]) 06/28/2016     1. Post-operative state   2. UTI symptoms     Patient likely has a UTI based on her  symptoms and some blood in her urine.   Plan:            1.  We will treat for UTI  2.  Reassured her regarding her abdominal incisions and the very small amount of bleeding she is having. She will contact us if her symptoms worsen. Orders Orders Placed This Encounter  Procedures   POCT urinalysis dipstick     Meds ordered this encounter  Medications   nitrofurantoin, macrocrystal-monohydrate, (MACROBID) 100 MG capsule    Sig: Take 1 capsule (100 mg total) by mouth 2 (two) times daily.    Dispense:  14 capsule    Refill:  0      F/U  Return in about 4 weeks (around 12/17/2020).  Finis Bud, M.D. 11/19/2020 2:51 PM

## 2020-11-19 NOTE — Progress Notes (Signed)
Pt present for 1 week post op visit. Pt stated having uti symptoms. UA completed and documented.

## 2020-11-19 NOTE — Progress Notes (Signed)
Error

## 2020-12-17 ENCOUNTER — Encounter: Payer: Self-pay | Admitting: Obstetrics and Gynecology

## 2020-12-17 ENCOUNTER — Other Ambulatory Visit (INDEPENDENT_AMBULATORY_CARE_PROVIDER_SITE_OTHER): Payer: 59

## 2020-12-17 ENCOUNTER — Ambulatory Visit (INDEPENDENT_AMBULATORY_CARE_PROVIDER_SITE_OTHER): Payer: 59 | Admitting: Obstetrics and Gynecology

## 2020-12-17 ENCOUNTER — Other Ambulatory Visit: Payer: Self-pay

## 2020-12-17 VITALS — BP 102/65 | HR 82 | Ht 62.0 in | Wt 152.9 lb

## 2020-12-17 DIAGNOSIS — R1032 Left lower quadrant pain: Secondary | ICD-10-CM

## 2020-12-17 DIAGNOSIS — R102 Pelvic and perineal pain: Secondary | ICD-10-CM | POA: Diagnosis not present

## 2020-12-17 DIAGNOSIS — Z9889 Other specified postprocedural states: Secondary | ICD-10-CM

## 2020-12-17 LAB — POCT URINALYSIS DIPSTICK
Bilirubin, UA: NEGATIVE
Blood, UA: NEGATIVE
Glucose, UA: NEGATIVE
Ketones, UA: NEGATIVE
Leukocytes, UA: NEGATIVE
Nitrite, UA: NEGATIVE
Protein, UA: POSITIVE — AB
Spec Grav, UA: 1.01 (ref 1.010–1.025)
Urobilinogen, UA: 0.2 E.U./dL
pH, UA: 8 (ref 5.0–8.0)

## 2020-12-17 NOTE — Progress Notes (Signed)
Patient complains of abdominal cramps for 4 days bilaterally, mainly on left. Patient also complains of sharp pains on left side as well as belly button. This pain impedes on patients sleep and makes her nauseous. Patient reports feeling inflammation near incisions. Patient states both legs are in pain, states it feels muscular and like she needs to stretch them, pain began 2 weeks ago after surgery. Patient reports bleeding has stopped. Patient states UTI symptoms have ceased, no dysuria or burning. Patient reports a big concern in being unable to sleep due to not being able to get comfortable.

## 2020-12-17 NOTE — Progress Notes (Signed)
HPI:      [name] is a 40 y.o. [phone] who LMP was No LMP recorded (lmp unknown). Patient has had a hysterectomy.  Subjective:   She presents today approximately 4 weeks from hysterectomy.  She reports that she was doing well until 4 days ago when she began having significantly worse pelvic pain especially left-sided.  This forced her to begin using her ibuprofen and her narcotics again.  She describes the pain as crampy.  She also reports having some difficulty sleeping because of the pain and being hot and sweaty at night. She denies fever or chills. She denies difficulty with urination or bowel movements. She also reports a change in the insulin requirement which has become significantly more over the last few days.    Hx: The following portions of the patient's history were reviewed and updated as appropriate:             She  has a past medical history of Anemia, Diabetes mellitus without complication ([place]), Hepatitis, Hyperlipidemia, and Vaginal bleeding. She does not have any pertinent problems on file. She  has a past surgical history that includes Cholecystectomy; Laparoscopic assisted vaginal hysterectomy (N/A, 11/10/2020); and Removal of drug delivery implant (N/A, 11/10/2020). Her family history includes Healthy in her father and mother. She  reports that she has never smoked. She has never used smokeless tobacco. She reports that she does not currently use alcohol. She reports that she does not use drugs. She has a current medication list which includes the following prescription(s): ibuprofen, insulin glargine, insulin regular, iron-vitamin c, oxycodone-acetaminophen, and nitrofurantoin (macrocrystal-monohydrate). She has No Known Allergies.       Review of Systems:  Review of Systems  Constitutional: Denied constitutional symptoms, night sweats, recent illness, fatigue, fever, insomnia and weight loss.  Eyes: Denied eye symptoms, eye pain, photophobia, vision  change and visual disturbance.  Ears/Nose/Throat/Neck: Denied ear, nose, throat or neck symptoms, hearing loss, nasal discharge, sinus congestion and sore throat.  Cardiovascular: Denied cardiovascular symptoms, arrhythmia, chest pain/pressure, edema, exercise intolerance, orthopnea and palpitations.  Respiratory: Denied pulmonary symptoms, asthma, pleuritic pain, productive sputum, cough, dyspnea and wheezing.  Gastrointestinal: Denied, gastro-esophageal reflux, melena, nausea and vomiting.  Genitourinary:  See HPI for additional information.  Musculoskeletal: Denied musculoskeletal symptoms, stiffness, swelling, muscle weakness and myalgia.  Dermatologic: Denied dermatology symptoms, rash and scar.  Neurologic: Denied neurology symptoms, dizziness, headache, neck pain and syncope.  Psychiatric: Denied psychiatric symptoms, anxiety and depression.  Endocrine: Denied endocrine symptoms including hot flashes and night sweats.   Meds:   Current Outpatient Medications on File Prior to Visit  Medication Sig Dispense Refill   ibuprofen (ADVIL) 600 MG tablet Take 1 tablet (600 mg total) by mouth 3 (three) times daily. 50 tablet 1   insulin glargine (LANTUS) 100 UNIT/ML Solostar Pen Inject 40 Units into the skin at bedtime.     insulin regular (NOVOLIN R,HUMULIN R) 100 units/mL injection Inject 30 Units into the skin 3 (three) times daily before meals.     Iron-Vitamin C (VITRON-C) 65-125 MG TABS Take 1 tablet by mouth 2 (two) times daily. 60 tablet 1   OXYCODONE-ACETAMINOPHEN PO Take by mouth.     nitrofurantoin, macrocrystal-monohydrate, (MACROBID) 100 MG capsule Take 1 capsule (100 mg total) by mouth 2 (two) times daily. (Patient not taking: Reported on 12/17/2020) 14 capsule 0   No current facility-administered medications on file prior to visit.      Objective:     Vitals:  12/17/20 1354  BP: 102/65  Pulse: 82   Filed Weights   12/17/20 1354  Weight: 152 lb 14.4 oz (69.4 kg)                Abdomen: Tenderness to palpation.  Guarding.  Left greater than right  Incision/s: Intact.  Healing well.  No erythema.  No drainage.             Assessment:    G2P2002 Patient Active Problem List   Diagnosis Date Noted   Iron deficiency anemia 03/28/2018   Poorly controlled type 1 diabetes mellitus ([place]) 06/28/2016     1. Pelvic pain   2. Left lower quadrant abdominal pain   3. Post-operative state        Plan:            1.  Pelvic ultrasound  Ultrasound reviewed.  No pelvic or abdominal abnormalities are noted.  No evidence of hernia.  No evidence of fluid collection.   2.  Because it is possible that the patient's resumption of narcotics in conjunction with taking iron daily has caused her to have some gas/constipation issues we have discussed use of fiber laxatives as well as simethicone.  I have reassured her that nothing was found on ultrasound.  I expect her to be significantly better within 2 weeks.  If she is getting worse she has been warned to contact us and we will continue her work-up at that time.  Orders Orders Placed This Encounter  Procedures   US PELVIS (TRANSABDOMINAL ONLY)   POCT Urinalysis Dipstick    No orders of the defined types were placed in this encounter.     F/U  Return in about 2 weeks (around 12/31/2020).  Finis Bud, M.D. 12/17/2020 3:38 PM

## 2021-01-01 ENCOUNTER — Ambulatory Visit (INDEPENDENT_AMBULATORY_CARE_PROVIDER_SITE_OTHER): Payer: 59 | Admitting: Obstetrics and Gynecology

## 2021-01-01 ENCOUNTER — Encounter: Payer: Self-pay | Admitting: Obstetrics and Gynecology

## 2021-01-01 ENCOUNTER — Other Ambulatory Visit: Payer: Self-pay

## 2021-01-01 VITALS — BP 115/75 | HR 94 | Ht 62.0 in | Wt 153.2 lb

## 2021-01-01 DIAGNOSIS — R399 Unspecified symptoms and signs involving the genitourinary system: Secondary | ICD-10-CM

## 2021-01-01 DIAGNOSIS — M79604 Pain in right leg: Secondary | ICD-10-CM

## 2021-01-01 DIAGNOSIS — M79605 Pain in left leg: Secondary | ICD-10-CM

## 2021-01-01 DIAGNOSIS — R102 Pelvic and perineal pain: Secondary | ICD-10-CM

## 2021-01-01 DIAGNOSIS — G8918 Other acute postprocedural pain: Secondary | ICD-10-CM

## 2021-01-01 LAB — POCT URINALYSIS DIPSTICK
Bilirubin, UA: NEGATIVE
Blood, UA: NEGATIVE
Glucose, UA: POSITIVE — AB
Ketones, UA: NEGATIVE
Leukocytes, UA: NEGATIVE
Nitrite, UA: NEGATIVE
Protein, UA: NEGATIVE
Spec Grav, UA: 1.02 (ref 1.010–1.025)
Urobilinogen, UA: 0.2 E.U./dL
pH, UA: 6 (ref 5.0–8.0)

## 2021-01-01 MED ORDER — GABAPENTIN 600 MG PO TABS: 600.0000 mg | ORAL_TABLET | Freq: Two times a day (BID) | ORAL | 0 refills | Status: DC

## 2021-01-01 NOTE — Progress Notes (Signed)
HPI:      Caitlyn Vincent is a 40 y.o. [phone] who LMP was No LMP recorded (lmp unknown). Patient has had a hysterectomy.  Subjective:   She presents today approximately 6 weeks postop.  She says her pain has improved but she still has occasional sharp stabbing pelvic pain when she rolls over.  She also states that she has bilateral leg pain that sometimes wakes her from sleep.  She also complains of midline pelvic pain and occasional urine loss. She denies vaginal bleeding.  She denies fever.    Hx: The following portions of the patient's history were reviewed and updated as appropriate:             She  has a past medical history of Anemia, Diabetes mellitus without complication ([place]), Hepatitis, Hyperlipidemia, and Vaginal bleeding. She does not have any pertinent problems on file. She  has a past surgical history that includes Cholecystectomy; Laparoscopic assisted vaginal hysterectomy (N/A, 11/10/2020); and Removal of drug delivery implant (N/A, 11/10/2020). Her family history includes Healthy in her father and mother. She  reports that she has never smoked. She has never used smokeless tobacco. She reports that she does not currently use alcohol. She reports that she does not use drugs. She has a current medication list which includes the following prescription(s): gabapentin, ibuprofen, insulin glargine, insulin regular, iron-vitamin c, oxycodone-acetaminophen, and nitrofurantoin (macrocrystal-monohydrate). She has No Known Allergies.       Review of Systems:  Review of Systems  Constitutional: Denied constitutional symptoms, night sweats, recent illness, fatigue, fever, insomnia and weight loss.  Eyes: Denied eye symptoms, eye pain, photophobia, vision change and visual disturbance.  Ears/Nose/Throat/Neck: Denied ear, nose, throat or neck symptoms, hearing loss, nasal discharge, sinus congestion and sore throat.  Cardiovascular: Denied cardiovascular symptoms, arrhythmia,  chest pain/pressure, edema, exercise intolerance, orthopnea and palpitations.  Respiratory: Denied pulmonary symptoms, asthma, pleuritic pain, productive sputum, cough, dyspnea and wheezing.  Gastrointestinal: Denied, gastro-esophageal reflux, melena, nausea and vomiting.  Genitourinary: See HPI for additional information.  Musculoskeletal: Denied musculoskeletal symptoms, stiffness, swelling, muscle weakness and myalgia.  Dermatologic: Denied dermatology symptoms, rash and scar.  Neurologic: Denied neurology symptoms, dizziness, headache, neck pain and syncope.  Psychiatric: Denied psychiatric symptoms, anxiety and depression.  Endocrine: Denied endocrine symptoms including hot flashes and night sweats.   Meds:   Current Outpatient Medications on File Prior to Visit  Medication Sig Dispense Refill   ibuprofen (ADVIL) 600 MG tablet Take 1 tablet (600 mg total) by mouth 3 (three) times daily. 50 tablet 1   insulin glargine (LANTUS) 100 UNIT/ML Solostar Pen Inject 40 Units into the skin at bedtime.     insulin regular (NOVOLIN R,HUMULIN R) 100 units/mL injection Inject 30 Units into the skin 3 (three) times daily before meals.     Iron-Vitamin C (VITRON-C) 65-125 MG TABS Take 1 tablet by mouth 2 (two) times daily. 60 tablet 1   OXYCODONE-ACETAMINOPHEN PO Take by mouth.     nitrofurantoin, macrocrystal-monohydrate, (MACROBID) 100 MG capsule Take 1 capsule (100 mg total) by mouth 2 (two) times daily. (Patient not taking: Reported on 12/17/2020) 14 capsule 0   No current facility-administered medications on file prior to visit.      Objective:     Vitals:   01/01/21 0934  BP: 115/75  Pulse: 94   Filed Weights   01/01/21 0934  Weight: 153 lb 3.2 oz (69.5 kg)  Abdomen is soft mild tenderness suprapubically - wounds healing well.  No erythema.  No guarding or rebound today.         Ultrasound performed 2 weeks ago shows no pelvic abnormalities.  Assessment:     G2P2002 Patient Active Problem List   Diagnosis Date Noted   Iron deficiency anemia 03/28/2018   Poorly controlled type 1 diabetes mellitus ([place]) 06/28/2016     1. Post-op pain   2. Urinary symptom or sign   3. Pelvic pain in female   4. Pain in both lower extremities     Pain continues to improve but is still not as good as she or I would anticipate.  She complains of significant bilateral leg pain at night.   Plan:            1.  Continue expected management as patient continues to improve.  2.  Patient using fiber laxatives and this has helped some.  3.  Urine for C&S  4.  Try gabapentin for leg pain  5.  Continue ibuprofen use. Orders Orders Placed This Encounter  Procedures   Urine Culture   POCT urinalysis dipstick     Meds ordered this encounter  Medications   gabapentin (NEURONTIN) 600 MG tablet    Sig: Take 1 tablet (600 mg total) by mouth 2 (two) times daily.    Dispense:  30 tablet    Refill:  0      F/U  Return in about 3 weeks (around 01/22/2021).  Finis Bud, M.D. 01/01/2021 10:10 AM

## 2021-01-06 LAB — URINE CULTURE

## 2021-01-11 ENCOUNTER — Other Ambulatory Visit: Payer: Self-pay | Admitting: Obstetrics and Gynecology

## 2021-01-11 DIAGNOSIS — R399 Unspecified symptoms and signs involving the genitourinary system: Secondary | ICD-10-CM

## 2021-01-11 MED ORDER — NITROFURANTOIN MONOHYD MACRO 100 MG PO CAPS
100.0000 mg | ORAL_CAPSULE | Freq: Two times a day (BID) | ORAL | 0 refills | Status: DC
Start: 2021-01-11 — End: 2021-01-22

## 2021-01-22 ENCOUNTER — Ambulatory Visit (INDEPENDENT_AMBULATORY_CARE_PROVIDER_SITE_OTHER): Payer: 59 | Admitting: Obstetrics and Gynecology

## 2021-01-22 ENCOUNTER — Other Ambulatory Visit: Payer: Self-pay

## 2021-01-22 ENCOUNTER — Encounter: Payer: Self-pay | Admitting: Obstetrics and Gynecology

## 2021-01-22 VITALS — BP 120/74 | HR 92 | Ht 62.0 in | Wt 151.0 lb

## 2021-01-22 DIAGNOSIS — R102 Pelvic and perineal pain: Secondary | ICD-10-CM

## 2021-01-22 NOTE — Progress Notes (Signed)
HPI:      [name] is a 41 y.o. [phone] who LMP was No LMP recorded (lmp unknown). Patient has had a hysterectomy.  Subjective:   She presents today continuing to complain of pelvic/abdominal pain, bilateral leg pain and weakness as well as occasional urge urinary incontinence.  She was diagnosed with a UTI and took the [place].  She states that she cannot tell any difference.  She still reports her urine as dark in color.  She does state that although she is drinking a lot of water her sugars are not as well-controlled as she wants them to be.  She took the gabapentin but reports that it made no difference in her leg pain.  She reports no problems with bowel movements. She is also requesting a return to work note.    Hx: The following portions of the patient's history were reviewed and updated as appropriate:             She  has a past medical history of Anemia, Diabetes mellitus without complication ([place]), Hepatitis, Hyperlipidemia, and Vaginal bleeding. She does not have any pertinent problems on file. She  has a past surgical history that includes Cholecystectomy; Laparoscopic assisted vaginal hysterectomy (N/A, 11/10/2020); and Removal of drug delivery implant (N/A, 11/10/2020). Her family history includes Healthy in her father and mother. She  reports that she has never smoked. She has never used smokeless tobacco. She reports that she does not currently use alcohol. She reports that she does not use drugs. She has a current medication list which includes the following prescription(s): dexcom g6 transmitter, ibuprofen, insulin glargine, insulin regular, iron-vitamin c, novolin r flexpen relion, gabapentin, and oxycodone-acetaminophen. She has No Known Allergies.       Review of Systems:  Review of Systems  Constitutional: Denied constitutional symptoms, night sweats, recent illness, fatigue, fever, insomnia and weight loss.  Eyes: Denied eye symptoms, eye pain,  photophobia, vision change and visual disturbance.  Ears/Nose/Throat/Neck: Denied ear, nose, throat or neck symptoms, hearing loss, nasal discharge, sinus congestion and sore throat.  Cardiovascular: Denied cardiovascular symptoms, arrhythmia, chest pain/pressure, edema, exercise intolerance, orthopnea and palpitations.  Respiratory: Denied pulmonary symptoms, asthma, pleuritic pain, productive sputum, cough, dyspnea and wheezing.  Gastrointestinal: Denied, gastro-esophageal reflux, melena, nausea and vomiting.  Genitourinary: See HPI for additional information.  Musculoskeletal: Denied musculoskeletal symptoms, stiffness, swelling, muscle weakness and myalgia.  Dermatologic: Denied dermatology symptoms, rash and scar.  Neurologic: Denied neurology symptoms, dizziness, headache, neck pain and syncope.  Psychiatric: Denied psychiatric symptoms, anxiety and depression.  Endocrine: Denied endocrine symptoms including hot flashes and night sweats.   Meds:   Current Outpatient Medications on File Prior to Visit  Medication Sig Dispense Refill   Continuous Blood Gluc Transmit (DEXCOM G6 TRANSMITTER) MISC USE TO MONITOR BLOOD SUGAR. REPLACE EVERY 3 MONTHS     ibuprofen (ADVIL) 600 MG tablet Take 1 tablet (600 mg total) by mouth 3 (three) times daily. 50 tablet 1   insulin glargine (LANTUS) 100 UNIT/ML Solostar Pen Inject 40 Units into the skin at bedtime.     insulin regular (NOVOLIN R,HUMULIN R) 100 units/mL injection Inject 30 Units into the skin 3 (three) times daily before meals.     Iron-Vitamin C (VITRON-C) 65-125 MG TABS Take 1 tablet by mouth 2 (two) times daily. 60 tablet 1   NOVOLIN R FLEXPEN RELION 100 UNIT/ML FlexPen Inject into the skin.     gabapentin (NEURONTIN) 600 MG tablet Take 1 tablet (600  mg total) by mouth 2 (two) times daily. (Patient not taking: Reported on 01/22/2021) 30 tablet 0   OXYCODONE-ACETAMINOPHEN PO Take by mouth. (Patient not taking: Reported on 01/22/2021)     No  current facility-administered medications on file prior to visit.      Objective:     Vitals:   01/22/21 0941  BP: 120/74  Pulse: 92   Filed Weights   01/22/21 0941  Weight: 151 lb (68.5 kg)                       Assessment:    [id number] Patient Active Problem List   Diagnosis Date Noted   Iron deficiency anemia 03/28/2018   Poorly controlled type 1 diabetes mellitus ([place]) 06/28/2016     1. Pelvic pain in female        Plan:            1.  Pelvic ultrasound ordered  2.  Urine for C&S  3.  Letter to return to work  4.  Consider Detrol if patient continues to lose urine. Orders Orders Placed This Encounter  Procedures   US PELVIS (TRANSABDOMINAL ONLY)   US PELVIS TRANSVAGINAL NON-OB (TV ONLY)    No orders of the defined types were placed in this encounter.     F/U  No follow-ups on file. I spent 21 minutes involved in the care of this patient preparing to see the patient by obtaining and reviewing her medical history (including labs, imaging tests and prior procedures), documenting clinical information in the electronic health record (EHR), counseling and coordinating care plans, writing and sending prescriptions, ordering tests or procedures and in direct communicating with the patient and medical staff discussing pertinent items from her history and physical exam.  Finis Bud, M.D. 01/22/2021 11:00 AM

## 2021-01-22 NOTE — Addendum Note (Signed)
Addended by: Meryl Dare on: 01/22/2021 11:01 AM   Modules accepted: Orders

## 2021-01-24 ENCOUNTER — Encounter: Payer: Self-pay | Admitting: Obstetrics and Gynecology

## 2021-01-24 LAB — URINE CULTURE

## 2021-01-24 NOTE — Progress Notes (Signed)
Caitlyn Vincent: After checking your urine-there is no evidence of urinary tract infection at this time. Dr. Amalia Hailey

## 2021-01-26 NOTE — Telephone Encounter (Signed)
Nithila: After checking your urine-there is no evidence of urinary tract infection at this time. Dr. Amalia Hailey  Patient called. Patient aware.

## 2021-01-29 ENCOUNTER — Other Ambulatory Visit: Payer: Self-pay

## 2021-01-29 ENCOUNTER — Ambulatory Visit (INDEPENDENT_AMBULATORY_CARE_PROVIDER_SITE_OTHER): Payer: 59

## 2021-01-29 ENCOUNTER — Other Ambulatory Visit: Payer: Self-pay | Admitting: Obstetrics and Gynecology

## 2021-01-29 DIAGNOSIS — R102 Pelvic and perineal pain: Secondary | ICD-10-CM

## 2021-02-03 ENCOUNTER — Encounter: Payer: Self-pay | Admitting: Obstetrics and Gynecology

## 2021-04-08 ENCOUNTER — Other Ambulatory Visit: Payer: Self-pay

## 2021-04-08 ENCOUNTER — Ambulatory Visit (INDEPENDENT_AMBULATORY_CARE_PROVIDER_SITE_OTHER): Payer: 59

## 2021-04-08 ENCOUNTER — Ambulatory Visit
Admission: EM | Admit: 2021-04-08 | Discharge: 2021-04-08 | Disposition: A | Discharge status: Home / Self Care | Payer: 59 | Attending: Emergency Medicine | Admitting: Emergency Medicine

## 2021-04-08 DIAGNOSIS — M79672 Pain in left foot: Secondary | ICD-10-CM | POA: Diagnosis not present

## 2021-04-08 DIAGNOSIS — G8929 Other chronic pain: Secondary | ICD-10-CM | POA: Diagnosis not present

## 2021-04-08 DIAGNOSIS — G8918 Other acute postprocedural pain: Secondary | ICD-10-CM

## 2021-04-08 MED ORDER — KETOROLAC TROMETHAMINE 30 MG/ML IJ SOLN
30.0000 mg | Freq: Once | INTRAMUSCULAR | Status: AC
  Administered 2021-04-08: 30 mg via INTRAMUSCULAR

## 2021-04-08 MED ORDER — ACETAMINOPHEN 325 MG PO TABS
975.0000 mg | ORAL_TABLET | Freq: Once | ORAL | Status: AC
  Administered 2021-04-08: 975 mg via ORAL

## 2021-04-08 MED ORDER — GABAPENTIN 600 MG PO TABS: 600.0000 mg | ORAL_TABLET | Freq: Two times a day (BID) | ORAL | 0 refills | Status: AC

## 2021-04-08 MED ORDER — IBUPROFEN 600 MG PO TABS: 600.0000 mg | ORAL_TABLET | Freq: Three times a day (TID) | ORAL | 0 refills | Status: AC

## 2021-04-08 NOTE — Discharge Instructions (Addendum)
I suspect that your pain is caused by your heel spur.  Continue your gel inserts, take 600 mg of ibuprofen with 1000 mg of Tylenol 3-4 times a day as needed for pain.  I have refilled your Neurontin, which will also help with nerve pain.  This could be partially caused by your diabetes.  Please follow-up with podiatry.  I have placed a referral for you, but please call them and make an appointment as well. ?

## 2021-04-08 NOTE — ED Provider Notes (Addendum)
HPI ? ?SUBJECTIVE: ? ?Caitlyn Vincent is a 41 y.o. female who presents with 2 months of constant, burning left heel and arch pain.  This has been diagnosed as Planter fasciitis.  She had an unknown cream applied to it, an injection, has tried wearing comfortable shoes, heel cups/insoles, stretching, night splints, and has been taking ibuprofen 200 mg every 8 hours along with hydrocodone without any improvement in her symptoms.  Symptoms are worse with standing.  She states that first heel strike in the morning is extremely painful.  She denies trauma, change in her physical activity, bruising, swelling, rash, erythema.  She spends 8 hours a day on her feet at her job.  She has not yet had any imaging of this foot.  She has a past medical history of poorly controlled diabetes type 1, anemia.  No history of peripheral neuropathy, chronic kidney disease.  LMP: She is status post hysterectomy.  PCP: [facility]. ? ?All history obtained through language line/video interpreter ? ?Past Medical History:  ?Diagnosis Date  ? Anemia   ? Diabetes mellitus without complication ([place])   ? Type II  ? Hepatitis   ? Hyperlipidemia   ? Vaginal bleeding   ? ? ?Past Surgical History:  ?Procedure Laterality Date  ? CHOLECYSTECTOMY    ? LAPAROSCOPIC ASSISTED VAGINAL HYSTERECTOMY N/A 11/10/2020  ? Procedure: LAPAROSCOPIC ASSISTED VAGINAL HYSTERECTOMY;  Surgeon: Harlin Heys, MD;  Location: [facility] ORS;  Service: Gynecology;  Laterality: N/A;  ? REMOVAL OF DRUG DELIVERY IMPLANT N/A 11/10/2020  ? Procedure: REMOVAL OF DRUG DELIVERY IMPLANT (NEXPLANON REMOVAL);  Surgeon: Harlin Heys, MD;  Location: [facility] ORS;  Service: Gynecology;  Laterality: N/A;  ? ? ?Family History  ?Problem Relation Age of Onset  ? Healthy Mother   ? Healthy Father   ? ? ?Social History  ? ?Tobacco Use  ? Smoking status: Never  ? Smokeless tobacco: Never  ?Vaping Use  ? Vaping Use: Never used  ?Substance Use Topics  ? Alcohol use: Not Currently   ? Drug use: No  ? ? ?No current facility-administered medications for this encounter. ? ?Current Outpatient Medications:  ?  Continuous Blood Gluc Transmit (DEXCOM G6 TRANSMITTER) MISC, USE TO MONITOR BLOOD SUGAR. REPLACE EVERY 3 MONTHS, Disp: , Rfl:  ?  insulin glargine (LANTUS) 100 UNIT/ML Solostar Pen, Inject 40 Units into the skin at bedtime., Disp: , Rfl:  ?  insulin regular (NOVOLIN R,HUMULIN R) 100 units/mL injection, Inject 30 Units into the skin 3 (three) times daily before meals., Disp: , Rfl:  ?  gabapentin (NEURONTIN) 600 MG tablet, Take 1 tablet (600 mg total) by mouth 2 (two) times daily., Disp: 30 tablet, Rfl: 0 ?  ibuprofen (ADVIL) 600 MG tablet, Take 1 tablet (600 mg total) by mouth 3 (three) times daily., Disp: 30 tablet, Rfl: 0 ?  NOVOLIN R FLEXPEN RELION 100 UNIT/ML FlexPen, Inject into the skin., Disp: , Rfl:  ? ?No Known Allergies ? ? ?ROS ? ?As noted in HPI.  ? ?Physical Exam ? ?BP 122/82 (BP Location: Right Arm)   Pulse 71   Temp 98.1 ?F (36.7 ?C) (Oral)   Resp 16   LMP  (LMP Unknown)   SpO2 96%  ? ?Constitutional: Well developed, well nourished, no acute distress ?Eyes:  EOMI, conjunctiva normal bilaterally ?HENT: Normocephalic, atraumatic,mucus membranes moist ?Respiratory: Normal inspiratory effort ?Cardiovascular: Normal rate ?GI: nondistended ?skin: No rash, skin intact ?Musculoskeletal left foot: Tenderness along the heel and proximal arch of  the foot.  Left midfoot NT. Base of fifth metatarsal NT. No bruising. Skin intact. DP 2+. Sensation grossly intact. Patient able to move all toes actively.   Distal fibula NT, Medial malleolus NT,  Deltoid ligament NT, Lateral ligaments NT, Achilles NT. Patient able to bear weight while in department.  ?Neurologic: Alert & oriented x 3, no focal neuro deficits ?Psychiatric: Speech and behavior appropriate ? ? ?ED Course ? ? ?Medications  ?acetaminophen (TYLENOL) tablet 975 mg (975 mg Oral Given 04/08/21 2125)  ?ketorolac (TORADOL) 30 MG/ML  injection 30 mg (30 mg Intramuscular Given 04/08/21 2125)  ? ? ?Orders Placed This Encounter  ?Procedures  ? DG Foot Complete Left  ?  Standing Status:   Standing  ?  Number of Occurrences:   1  ?  Order Specific Question:   Reason for Exam (SYMPTOM  OR DIAGNOSIS REQUIRED)  ?  Answer:   foot pain  ?  Order Specific Question:   Release to patient  ?  Answer:   Immediate  ? Ambulatory referral to Podiatry  ?  Referral Priority:   Routine  ?  Referral Type:   Consultation  ?  Referral Reason:   Specialty Services Required  ?  Requested Specialty:   Podiatry  ?  Number of Visits Requested:   1  ? ? ?No results found for this or any previous visit (from the past 24 hour(s)). ?DG Foot Complete Left ? ?Result Date: 04/08/2021 ?CLINICAL DATA:  Foot pain. EXAM: LEFT FOOT - COMPLETE 3+ VIEW COMPARISON:  None. FINDINGS: There is no evidence of fracture or dislocation. Joint spaces appear well maintained. There is a plantar calcaneal spur. Soft tissues are within normal limits. Soft tissues are unremarkable. IMPRESSION: Negative. Electronically Signed   By: Ronney Asters M.D.   On: 04/08/2021 21:13   ? ?ED Clinical Impression ? ?1. Chronic foot pain, left   ?2. Post-op pain   ?  ?ignore diagnosis of postoperative pain.  Patient has not had any surgery on her foot.  I did not make this diagnosis.  ? ?ED Assessment/Plan ? ? ?Reviewed imaging independently.  No fracture.  Plantar calcaneal spur.  See radiology report for full details. ? ?Suspect planter fasciitis with a calcaneal heel spur, but it could also be a diabetic neuropathy.  Giving Toradol and Tylenol here.  We will send send home with ibuprofen 600 mg for her to take with Tylenol, will refill her Neurontin, referral to podiatry.  Work note for today and tomorrow ? ?Using the video interpreter, discussed imaging, MDM, treatment plan, and plan for follow-up with patient. patient agrees with plan.  ? ?Meds ordered this encounter  ?Medications  ? acetaminophen (TYLENOL)  tablet 975 mg  ? ketorolac (TORADOL) 30 MG/ML injection 30 mg  ? gabapentin (NEURONTIN) 600 MG tablet  ?  Sig: Take 1 tablet (600 mg total) by mouth 2 (two) times daily.  ?  Dispense:  30 tablet  ?  Refill:  0  ? ibuprofen (ADVIL) 600 MG tablet  ?  Sig: Take 1 tablet (600 mg total) by mouth 3 (three) times daily.  ?  Dispense:  30 tablet  ?  Refill:  0  ? ? ? ? ?*This clinic note was created using [employer]. Therefore, there may be occasional mistakes despite careful proofreading. ? ?? ? ?  ?Melynda Ripple, MD ?04/09/21 1238 ? ?  ?Melynda Ripple, MD ?04/09/21 1241 ? ?

## 2021-04-08 NOTE — ED Triage Notes (Signed)
Spoke to pt via [place] interpreter [place] [phone].   ? ?Pt states she had plantar fasciitis and had something done to it, was told it would improve but it isn't.  Was also told to use a splint but that didn't help.  In last week, pain has worsened and she has a lot of burning on bottom of foot.   ? ?No new injury or increase in physical activity.   ?

## 2021-04-09 ENCOUNTER — Ambulatory Visit (INDEPENDENT_AMBULATORY_CARE_PROVIDER_SITE_OTHER): Payer: 59 | Admitting: Podiatry

## 2021-04-09 ENCOUNTER — Encounter: Payer: Self-pay | Admitting: Hematology and Oncology

## 2021-04-09 DIAGNOSIS — M722 Plantar fascial fibromatosis: Secondary | ICD-10-CM

## 2021-04-10 ENCOUNTER — Encounter: Payer: Self-pay | Admitting: Podiatry

## 2021-04-13 ENCOUNTER — Encounter: Payer: Self-pay | Admitting: Podiatry

## 2021-04-14 ENCOUNTER — Encounter: Payer: Self-pay | Admitting: Podiatry

## 2021-04-14 NOTE — Progress Notes (Signed)
?Subjective:  ?Patient ID: Caitlyn Vincent, female    DOB: July 31, 1980,  MRN: [id number] ? ?Chief Complaint  ?Patient presents with  ? Foot Pain  ?  Left foot pain   ? ? ?41 y.o. female presents with the above complaint.  Patient presents with left heel pain that has been going on on the bottom of the foot.  She states it is burning sensation.  She went to urgent care who took x-rays.  She is a type I diabetic with last A1c of 10.5.  She has not seen anyone else prior to seeing me for this.  Pain scale 7 out of 10 hurts with ambulation is progressive gotten worse.  She is here with her interpreter. ? ? ?Review of Systems: Negative except as noted in the HPI. Denies N/V/F/Ch. ? ?Past Medical History:  ?Diagnosis Date  ? Anemia   ? Diabetes mellitus without complication ([place])   ? Type II  ? Hepatitis   ? Hyperlipidemia   ? Vaginal bleeding   ? ? ?Current Outpatient Medications:  ?  Continuous Blood Gluc Transmit (DEXCOM G6 TRANSMITTER) MISC, USE TO MONITOR BLOOD SUGAR. REPLACE EVERY 3 MONTHS, Disp: , Rfl:  ?  gabapentin (NEURONTIN) 600 MG tablet, Take 1 tablet (600 mg total) by mouth 2 (two) times daily., Disp: 30 tablet, Rfl: 0 ?  ibuprofen (ADVIL) 600 MG tablet, Take 1 tablet (600 mg total) by mouth 3 (three) times daily., Disp: 30 tablet, Rfl: 0 ?  insulin glargine (LANTUS) 100 UNIT/ML Solostar Pen, Inject 40 Units into the skin at bedtime., Disp: , Rfl:  ?  insulin regular (NOVOLIN R,HUMULIN R) 100 units/mL injection, Inject 30 Units into the skin 3 (three) times daily before meals., Disp: , Rfl:  ?  NOVOLIN R FLEXPEN RELION 100 UNIT/ML FlexPen, Inject into the skin., Disp: , Rfl:  ? ?Social History  ? ?Tobacco Use  ?Smoking Status Never  ?Smokeless Tobacco Never  ? ? ?No Known Allergies ?Objective:  ?There were no vitals filed for this visit. ?There is no height or weight on file to calculate BMI. ?Constitutional Well developed. ?Well nourished.  ?Vascular Dorsalis pedis pulses palpable  bilaterally. ?Posterior tibial pulses palpable bilaterally. ?Capillary refill normal to all digits.  ?No cyanosis or clubbing noted. ?Pedal hair growth normal.  ?Neurologic Normal speech. ?Oriented to person, place, and time. ?Epicritic sensation to light touch grossly present bilaterally.  ?Dermatologic Nails well groomed and normal in appearance. ?No open wounds. ?No skin lesions.  ?Orthopedic: Normal joint ROM without pain or crepitus bilaterally. ?No visible deformities. ?Tender to palpation at the calcaneal tuber left. ?No pain with calcaneal squeeze left. ?Ankle ROM diminished range of motion left. ?Silfverskiold Test: positive left.  ? ?Radiographs: Taken and reviewed. No acute fractures or dislocations. No evidence of stress fracture.  Plantar heel spur present. Posterior heel spur absent.  ? ?Assessment:  ? ?1. Plantar fasciitis of left foot   ? ?Plan:  ?Patient was evaluated and treated and all questions answered. ? ?Plantar Fasciitis, left ?- XR reviewed as above.  ?- Educated on icing and stretching. Instructions given.  ?- Injection delivered to the plantar fascia as below. ?- DME: Plantar fascial brace dispensed to support the medial longitudinal arch of the foot and offload pressure from the heel and prevent arch collapse during weightbearing ?- Pharmacologic management: None ? ?Procedure: Injection Tendon/Ligament ?Location: Left plantar fascia at the glabrous junction; medial approach. ?Skin Prep: alcohol ?Injectate: 0.5 cc 0.5% marcaine plain, 0.5 cc of  1% Lidocaine, 0.5 cc kenalog 10. ?Disposition: Patient tolerated procedure well. Injection site dressed with a band-aid. ? ?No follow-ups on file. ?

## 2021-05-07 ENCOUNTER — Encounter: Payer: Self-pay | Admitting: Podiatry

## 2021-05-07 ENCOUNTER — Ambulatory Visit (INDEPENDENT_AMBULATORY_CARE_PROVIDER_SITE_OTHER): Payer: 59 | Admitting: Podiatry

## 2021-05-07 DIAGNOSIS — M722 Plantar fascial fibromatosis: Secondary | ICD-10-CM | POA: Diagnosis not present

## 2021-05-07 DIAGNOSIS — L600 Ingrowing nail: Secondary | ICD-10-CM

## 2021-05-07 DIAGNOSIS — Q666 Other congenital valgus deformities of feet: Secondary | ICD-10-CM | POA: Diagnosis not present

## 2021-05-07 NOTE — Progress Notes (Signed)
?Subjective:  ?Patient ID: Caitlyn Vincent, female    DOB: 08/20/1980,  MRN: [id number] ? ?Chief Complaint  ?Patient presents with  ? Plantar Fasciitis  ? ? ?41 y.o. female presents with the above complaint.  Patient presents for follow-up to left Planter fasciitis.  The first injection helped considerably.  She is about 80% better.  She would like to know if she can do a second injection she wanted to discuss other treatment options for it.  She has secondary complaint left hallux medial border ingrown painful to touch is progressive gotten worse painful with soreness pain with certain shoes.  She would like to have removed. ? ? ?Review of Systems: Negative except as noted in the HPI. Denies N/V/F/Ch. ? ?Past Medical History:  ?Diagnosis Date  ? Anemia   ? Diabetes mellitus without complication ([place])   ? Type II  ? Hepatitis   ? Hyperlipidemia   ? Vaginal bleeding   ? ? ?Current Outpatient Medications:  ?  Continuous Blood Gluc Transmit (DEXCOM G6 TRANSMITTER) MISC, USE TO MONITOR BLOOD SUGAR. REPLACE EVERY 3 MONTHS, Disp: , Rfl:  ?  gabapentin (NEURONTIN) 600 MG tablet, Take 1 tablet (600 mg total) by mouth 2 (two) times daily., Disp: 30 tablet, Rfl: 0 ?  ibuprofen (ADVIL) 600 MG tablet, Take 1 tablet (600 mg total) by mouth 3 (three) times daily., Disp: 30 tablet, Rfl: 0 ?  insulin glargine (LANTUS) 100 UNIT/ML Solostar Pen, Inject 40 Units into the skin at bedtime., Disp: , Rfl:  ?  insulin regular (NOVOLIN R,HUMULIN R) 100 units/mL injection, Inject 30 Units into the skin 3 (three) times daily before meals., Disp: , Rfl:  ?  NOVOLIN R FLEXPEN RELION 100 UNIT/ML FlexPen, Inject into the skin., Disp: , Rfl:  ? ?Social History  ? ?Tobacco Use  ?Smoking Status Never  ?Smokeless Tobacco Never  ? ? ?No Known Allergies ?Objective:  ?There were no vitals filed for this visit. ?There is no height or weight on file to calculate BMI. ?Constitutional Well developed. ?Well nourished.  ?Vascular Dorsalis pedis pulses  palpable bilaterally. ?Posterior tibial pulses palpable bilaterally. ?Capillary refill normal to all digits.  ?No cyanosis or clubbing noted. ?Pedal hair growth normal.  ?Neurologic Normal speech. ?Oriented to person, place, and time. ?Epicritic sensation to light touch grossly present bilaterally.  ?Dermatologic Painful ingrowing nail at medial nail borders of the hallux nail left. ?No open wounds. ?No skin lesions.  ?Orthopedic: Normal joint ROM without pain or crepitus bilaterally. ?No visible deformities. ?Tender to palpation at the calcaneal tuber left. ?No pain with calcaneal squeeze left. ?Ankle ROM diminished range of motion left. ?Silfverskiold Test: positive left.  ? ?Radiographs: Taken and reviewed. No acute fractures or dislocations. No evidence of stress fracture.  Plantar heel spur present. Posterior heel spur absent.  ? ?Assessment:  ? ?1. Plantar fasciitis of left foot   ?2. Ingrown left big toenail   ?3. Pes planovalgus   ? ? ?Plan:  ?Patient was evaluated and treated and all questions answered. ? ?Plantar Fasciitis, left ?- XR reviewed as above.  ?- Educated on icing and stretching. Instructions given.  ?-Second injection delivered to the plantar fascia as below. ?- DME: Plantar fascial brace dispensed to support the medial longitudinal arch of the foot and offload pressure from the heel and prevent arch collapse during weightbearing ?- Pharmacologic management: None ? ?Procedure: Injection Tendon/Ligament ?Location: Left plantar fascia at the glabrous junction; medial approach. ?Skin Prep: alcohol ?Injectate: 0.5 cc 0.5%  marcaine plain, 0.5 cc of 1% Lidocaine, 0.5 cc kenalog 10. ?Disposition: Patient tolerated procedure well. Injection site dressed with a band-aid. ? ?Pes planovalgus ?-I explained to patient the etiology of pes planovalgus and relationship with Planter fasciitis and various treatment options were discussed.  Given patient foot structure in the setting of Planter fasciitis I  believe patient will benefit from custom-made orthotics to help control the hindfoot motion support the arch of the foot and take the stress away from plantar fascial.  For now we will continue with power steps.  If there is no improvement in power so we will discuss custom orthotics.  She states understanding ? ? ? ?Ingrown Nail, left ?-Patient elects to proceed with minor surgery to remove ingrown toenail removal today. Consent reviewed and signed by patient. ?-Ingrown nail excised. See procedure note. ?-Educated on post-procedure care including soaking. Written instructions provided and reviewed. ?-Patient to follow up in 2 weeks for nail check. ?-I discussed with her given that she is a diabetic with last A1c of 10.5 she is a high risk of losing the toe if the infection gets worse.  However she would like to proceed despite the risks. ? ?Procedure: Excision of Ingrown Toenail ?Location: Left 1st toe medial nail borders. ?Anesthesia: Lidocaine 1% plain; 1.5 mL and Marcaine 0.5% plain; 1.5 mL, digital block. ?Skin Prep: Betadine. ?Dressing: Silvadene; telfa; dry, sterile, compression dressing. ?Technique: Following skin prep, the toe was exsanguinated and a tourniquet was secured at the base of the toe. The affected nail border was freed, split with a nail splitter, and excised. Chemical matrixectomy was then performed with phenol and irrigated out with alcohol. The tourniquet was then removed and sterile dressing applied. ?Disposition: Patient tolerated procedure well. Patient to return in 2 weeks for follow-up.  ? ?No follow-ups on file. ? ?No follow-ups on file. ?

## 2021-05-07 NOTE — Patient Instructions (Signed)

## 2021-05-12 ENCOUNTER — Encounter: Payer: Self-pay | Admitting: *Deleted

## 2021-05-12 ENCOUNTER — Ambulatory Visit (INDEPENDENT_AMBULATORY_CARE_PROVIDER_SITE_OTHER): Payer: 59 | Admitting: Podiatry

## 2021-05-12 DIAGNOSIS — L600 Ingrowing nail: Secondary | ICD-10-CM | POA: Diagnosis not present

## 2021-05-12 DIAGNOSIS — E1065 Type 1 diabetes mellitus with hyperglycemia: Secondary | ICD-10-CM | POA: Diagnosis not present

## 2021-05-12 DIAGNOSIS — L03032 Cellulitis of left toe: Secondary | ICD-10-CM

## 2021-05-12 MED ORDER — DOXYCYCLINE HYCLATE 100 MG PO TABS: 100.0000 mg | ORAL_TABLET | Freq: Two times a day (BID) | ORAL | 0 refills | Status: AC

## 2021-05-15 NOTE — Progress Notes (Signed)
?  Subjective:  ?Patient ID: Caitlyn Vincent, female    DOB: [birth year],  MRN: [id number] ? ?Chief Complaint  ?Patient presents with  ? Nail Problem  ?  Pt thinks her nail is infected   ? ? ?41 y.o. female presents with the above complaint.  Patient presents with complaint of paronychia to the left hallux.  Patient states is painful.  She thinks is infected.  She is not taking any antibiotics she denies any other acute complaint she has not seen anyone else prior to seeing me.  She is a diabetic with last A1c of 10.5. ? ? ?Review of Systems: Negative except as noted in the HPI. Denies N/V/F/Ch. ? ?Past Medical History:  ?Diagnosis Date  ? Anemia   ? Diabetes mellitus without complication ([place])   ? Type II  ? Hepatitis   ? Hyperlipidemia   ? Vaginal bleeding   ? ? ?Current Outpatient Medications:  ?  doxycycline (VIBRA-TABS) 100 MG tablet, Take 1 tablet (100 mg total) by mouth 2 (two) times daily for 10 days., Disp: 20 tablet, Rfl: 0 ?  Continuous Blood Gluc Transmit (DEXCOM G6 TRANSMITTER) MISC, USE TO MONITOR BLOOD SUGAR. REPLACE EVERY 3 MONTHS, Disp: , Rfl:  ?  gabapentin (NEURONTIN) 600 MG tablet, Take 1 tablet (600 mg total) by mouth 2 (two) times daily., Disp: 30 tablet, Rfl: 0 ?  ibuprofen (ADVIL) 600 MG tablet, Take 1 tablet (600 mg total) by mouth 3 (three) times daily., Disp: 30 tablet, Rfl: 0 ?  insulin glargine (LANTUS) 100 UNIT/ML Solostar Pen, Inject 40 Units into the skin at bedtime., Disp: , Rfl:  ?  insulin regular (NOVOLIN R,HUMULIN R) 100 units/mL injection, Inject 30 Units into the skin 3 (three) times daily before meals., Disp: , Rfl:  ?  NOVOLIN R FLEXPEN RELION 100 UNIT/ML FlexPen, Inject into the skin., Disp: , Rfl:  ? ?Social History  ? ?Tobacco Use  ?Smoking Status Never  ?Smokeless Tobacco Never  ? ? ?No Known Allergies ?Objective:  ?There were no vitals filed for this visit. ?There is no height or weight on file to calculate BMI. ?Constitutional Well developed. ?Well nourished.   ?Vascular Dorsalis pedis pulses palpable bilaterally. ?Posterior tibial pulses palpable bilaterally. ?Capillary refill normal to all digits.  ?No cyanosis or clubbing noted. ?Pedal hair growth normal.  ?Neurologic Normal speech. ?Oriented to person, place, and time. ?Epicritic sensation to light touch grossly present bilaterally.  ?Dermatologic Left hallux paronychia with slight case of ingrown noted.  Redness noted proximally.  No cellulitis.  No purulent drainage noted.  ?Orthopedic: Normal joint ROM without pain or crepitus bilaterally. ?No visible deformities. ?No bony tenderness.  ? ?Radiographs: None ?Assessment:  ? ?1. Paronychia of toe of left foot due to ingrown toenail   ?2. Poorly controlled type 1 diabetes mellitus ([place])   ? ?Plan:  ?Patient was evaluated and treated and all questions answered. ? ?Left hallux paronychia ?-I explained the patient the etiology of paronychia versus treatment options were discussed.  Given that she is a diabetic for now we will focus on antibiotic to get rid of the redness.  Her A1c is 10.5 and it puts her at high risk of losing the toe.  I discussed with the patient she states understanding. ?-Doxycycline was dispensed ? ?No follow-ups on file. ?

## 2021-06-09 ENCOUNTER — Ambulatory Visit (INDEPENDENT_AMBULATORY_CARE_PROVIDER_SITE_OTHER): Payer: 59 | Admitting: Podiatry

## 2021-06-09 ENCOUNTER — Encounter: Payer: Self-pay | Admitting: Podiatry

## 2021-06-09 DIAGNOSIS — L600 Ingrowing nail: Secondary | ICD-10-CM | POA: Diagnosis not present

## 2021-06-09 DIAGNOSIS — M722 Plantar fascial fibromatosis: Secondary | ICD-10-CM | POA: Diagnosis not present

## 2021-06-09 DIAGNOSIS — L03032 Cellulitis of left toe: Secondary | ICD-10-CM | POA: Diagnosis not present

## 2021-06-09 NOTE — Progress Notes (Signed)
Subjective:  Patient ID: Caitlyn Vincent, female    DOB: Jul 20, 1980,  MRN: [id number]  Chief Complaint  Patient presents with   Foot Pain    41 y.o. female presents with the above complaint.  Patient presents for follow-up to left Planter fasciitis.  She states the injection does help however the last 1 did not help as much.  She states she will try like to try 1 more injection.  If there is no improvement we will discuss cam boot immobilization during next visit.  The ingrown side is doing well.  No pain.  The redness has improved.  She has completed a course of antibiotics.   Review of Systems: Negative except as noted in the HPI. Denies N/V/F/Ch.  Past Medical History:  Diagnosis Date   Anemia    Diabetes mellitus without complication ([facility])    Type II   Hepatitis    Hyperlipidemia    Vaginal bleeding     Current Outpatient Medications:    Continuous Blood Gluc Transmit (DEXCOM G6 TRANSMITTER) MISC, USE TO MONITOR BLOOD SUGAR. REPLACE EVERY 3 MONTHS, Disp: , Rfl:    gabapentin (NEURONTIN) 600 MG tablet, Take 1 tablet (600 mg total) by mouth 2 (two) times daily., Disp: 30 tablet, Rfl: 0   ibuprofen (ADVIL) 600 MG tablet, Take 1 tablet (600 mg total) by mouth 3 (three) times daily., Disp: 30 tablet, Rfl: 0   insulin glargine (LANTUS) 100 UNIT/ML Solostar Pen, Inject 40 Units into the skin at bedtime., Disp: , Rfl:    insulin regular (NOVOLIN R,HUMULIN R) 100 units/mL injection, Inject 30 Units into the skin 3 (three) times daily before meals., Disp: , Rfl:    NOVOLIN R FLEXPEN RELION 100 UNIT/ML FlexPen, Inject into the skin., Disp: , Rfl:   Social History   Tobacco Use  Smoking Status Never  Smokeless Tobacco Never    No Known Allergies Objective:  There were no vitals filed for this visit. There is no height or weight on file to calculate BMI. Constitutional Well developed. Well nourished.  Vascular Dorsalis pedis pulses palpable bilaterally. Posterior tibial  pulses palpable bilaterally. Capillary refill normal to all digits.  No cyanosis or clubbing noted. Pedal hair growth normal.  Neurologic Normal speech. Oriented to person, place, and time. Epicritic sensation to light touch grossly present bilaterally.  Dermatologic No further painful ingrowing nail at medial nail borders of the hallux nail left. No open wounds. No skin lesions.  Orthopedic: Normal joint ROM without pain or crepitus bilaterally. No visible deformities. Tender to palpation at the calcaneal tuber left. No pain with calcaneal squeeze left. Ankle ROM diminished range of motion left. Silfverskiold Test: positive left.   Radiographs: Taken and reviewed. No acute fractures or dislocations. No evidence of stress fracture.  Plantar heel spur present. Posterior heel spur absent.   Assessment:   1. Paronychia of toe of left foot due to ingrown toenail   2. Plantar fasciitis of left foot      Plan:  Patient was evaluated and treated and all questions answered.  Plantar Fasciitis, left - XR reviewed as above.  - Educated on icing and stretching. Instructions given.  -Third injection delivered to the plantar fascia as below. - DME: Plantar fascial brace dispensed to support the medial longitudinal arch of the foot and offload pressure from the heel and prevent arch collapse during weightbearing - Pharmacologic management: None -If there is no improvement we will discuss cam boot immobilization during next clinical visit.  Procedure: Injection Tendon/Ligament Location: Left plantar fascia at the glabrous junction; medial approach. Skin Prep: alcohol Injectate: 0.5 cc 0.5% marcaine plain, 0.5 cc of 1% Lidocaine, 0.5 cc kenalog 10. Disposition: Patient tolerated procedure well. Injection site dressed with a band-aid.  Pes planovalgus -I explained to patient the etiology of pes planovalgus and relationship with Planter fasciitis and various treatment options were  discussed.  Given patient foot structure in the setting of Planter fasciitis I believe patient will benefit from custom-made orthotics to help control the hindfoot motion support the arch of the foot and take the stress away from plantar fascial.  For now we will continue with power steps.  If there is no improvement in power so we will discuss custom orthotics.  She states understanding    Ingrown Nail, left -Clinically healed and is doing well.  No concern for infection.  No follow-ups on file.  No follow-ups on file.

## 2021-07-07 ENCOUNTER — Ambulatory Visit: Payer: 59 | Admitting: Podiatry

## 2021-10-07 ENCOUNTER — Encounter: Payer: Self-pay | Admitting: Emergency Medicine

## 2021-10-07 ENCOUNTER — Emergency Department
Admission: EM | Admit: 2021-10-07 | Discharge: 2021-10-07 | Disposition: A | Discharge status: Home / Self Care | Payer: 59 | Attending: Emergency Medicine | Admitting: Emergency Medicine

## 2021-10-07 DIAGNOSIS — L0501 Pilonidal cyst with abscess: Secondary | ICD-10-CM | POA: Insufficient documentation

## 2021-10-07 DIAGNOSIS — E119 Type 2 diabetes mellitus without complications: Secondary | ICD-10-CM | POA: Diagnosis not present

## 2021-10-07 LAB — CBC WITH DIFFERENTIAL/PLATELET
Abs Immature Granulocytes: 0.01 10*3/uL (ref 0.00–0.07)
Basophils Absolute: 0.1 10*3/uL (ref 0.0–0.1)
Basophils Relative: 1 %
Eosinophils Absolute: 0.3 10*3/uL (ref 0.0–0.5)
Eosinophils Relative: 5 %
HCT: 39.6 % (ref 36.0–46.0)
Hemoglobin: 13.4 g/dL (ref 12.0–15.0)
Immature Granulocytes: 0 %
Lymphocytes Relative: 31 %
Lymphs Abs: 1.8 10*3/uL (ref 0.7–4.0)
MCH: 28 pg (ref 26.0–34.0)
MCHC: 33.8 g/dL (ref 30.0–36.0)
MCV: 82.8 fL (ref 80.0–100.0)
Monocytes Absolute: 0.4 10*3/uL (ref 0.1–1.0)
Monocytes Relative: 7 %
Neutro Abs: 3.1 10*3/uL (ref 1.7–7.7)
Neutrophils Relative %: 56 %
Platelets: 373 10*3/uL (ref 150–400)
RBC: 4.78 MIL/uL (ref 3.87–5.11)
RDW: 12.3 % (ref 11.5–15.5)
WBC: 5.7 10*3/uL (ref 4.0–10.5)
nRBC: 0 % (ref 0.0–0.2)

## 2021-10-07 LAB — COMPREHENSIVE METABOLIC PANEL
ALT: 15 U/L (ref 0–44)
AST: 18 U/L (ref 15–41)
Albumin: 3.8 g/dL (ref 3.5–5.0)
Alkaline Phosphatase: 68 U/L (ref 38–126)
Anion gap: 6 (ref 5–15)
BUN: 17 mg/dL (ref 6–20)
CO2: 23 mmol/L (ref 22–32)
Calcium: 9 mg/dL (ref 8.9–10.3)
Chloride: 104 mmol/L (ref 98–111)
Creatinine, Ser: 0.62 mg/dL (ref 0.44–1.00)
GFR, Estimated: >60 mL/min (ref >60)
Glucose, Bld: 337 mg/dL — ABNORMAL HIGH (ref 70–99)
Potassium: 4.4 mmol/L (ref 3.5–5.1)
Sodium: 133 mmol/L — ABNORMAL LOW (ref 135–145)
Total Bilirubin: 0.7 mg/dL (ref 0.3–1.2)
Total Protein: 7.4 g/dL (ref 6.5–8.1)

## 2021-10-07 LAB — CBG MONITORING, ED: Glucose-Capillary: 314 mg/dL — ABNORMAL HIGH (ref 70–99)

## 2021-10-07 MED ORDER — LIDOCAINE-EPINEPHRINE (PF) 2 %-1:200000 IJ SOLN
10.0000 mL | Freq: Once | INTRAMUSCULAR | Status: AC
  Administered 2021-10-07: 10 mL
  Filled 2021-10-07: qty 20

## 2021-10-07 MED ORDER — DOXYCYCLINE MONOHYDRATE 100 MG PO TABS: 100.0000 mg | ORAL_TABLET | Freq: Two times a day (BID) | ORAL | 0 refills | Status: DC

## 2021-10-07 MED ORDER — HYDROCODONE-ACETAMINOPHEN 5-325 MG PO TABS: 1.0000 | ORAL_TABLET | Freq: Four times a day (QID) | ORAL | 0 refills | Status: AC | PRN

## 2021-10-07 MED ORDER — ACETAMINOPHEN 500 MG PO TABS
1000.0000 mg | ORAL_TABLET | Freq: Once | ORAL | Status: AC
  Administered 2021-10-07: 1000 mg via ORAL
  Filled 2021-10-07: qty 2

## 2021-10-07 MED ORDER — SULFAMETHOXAZOLE-TRIMETHOPRIM 800-160 MG PO TABS: 1.0000 | ORAL_TABLET | Freq: Two times a day (BID) | ORAL | 0 refills | Status: AC

## 2021-10-07 NOTE — ED Triage Notes (Signed)
Abcess to coccyx x 5 days

## 2021-10-07 NOTE — Discharge Instructions (Addendum)
-  Please take the full course of the antibiotics as prescribed.  -You may take the hydrocodone/acetaminophen as needed, to use caution as it may make you dizzy/drowsy.  -Follow-up with your primary care provider as needed.  -Return to the emergency department anytime if you begin to experience any new or worsening symptoms.

## 2021-10-07 NOTE — ED Provider Notes (Signed)
[facility] Provider Note    Event Date/Time   First MD Initiated Contact with Patient 10/07/21 [phone]     (approximate)   History   Chief Complaint Abscess   HPI Caitlyn Vincent is a 41 y.o. female, history of diabetes, anemia, hyperlipidemia, presents to the emergency department for evaluation of suspected abscess along the coccyx region.  She states that she first noted some swelling and tenderness approximately 5 days ago.  She says it feels hard and rough, like another bone.  Reports exquisite amount of pain, particular when sitting down.  She has not had anything like this before.  Denies fever/chills, bleeding/discharge, abdominal pain, dysuria, saddle anesthesia, bowel/bladder dysfunction, shortness of breath, rashes/lesions, or dizziness/lightheadedness.  History Limitations: No limitations.        Physical Exam  Triage Vital Signs: ED Triage Vitals  Enc Vitals Group     BP 10/07/21 0941 120/71     Pulse Rate 10/07/21 0941 72     Resp 10/07/21 0941 14     Temp 10/07/21 0941 98.4 F (36.9 C)     Temp src --      SpO2 10/07/21 0941 94 %     Weight 10/07/21 0926 151 lb 0.2 oz (68.5 kg)     Height 10/07/21 0926 '5\' 2"'$  (1.575 m)     Head Circumference --      Peak Flow --      Pain Score 10/07/21 0925 7     Pain Loc --      Pain Edu? --      Excl. in [place]? --     Most recent vital signs: Vitals:   10/07/21 0941  BP: 120/71  Pulse: 72  Resp: 14  Temp: 98.4 F (36.9 C)  SpO2: 94%    General: Awake, NAD.  Skin: Warm, dry. No rashes or lesions.  Eyes: PERRL. Conjunctivae normal.  CV: Good peripheral perfusion.  Resp: Normal effort.  Abd: Soft, non-tender. No distention.  Neuro: At baseline. No gross neurological deficits.  Musculoskeletal: Normal ROM of all extremities.   Focused Exam: Quarter sized erythema and induration present along the coccyx region, extending just to the right and left sides.  No active bleeding or  discharge.  No notable fluctuance.  Point-of-care ultrasound does show significant cobblestoning.  No drainable abscess at this time.  Physical Exam    ED Results / Procedures / Treatments  Labs (all labs ordered are listed, but only abnormal results are displayed) Labs Reviewed  COMPREHENSIVE METABOLIC PANEL - Abnormal; Notable for the following components:      Result Value   Sodium 133 (*)    Glucose, Bld 337 (*)    All other components within normal limits  CBG MONITORING, ED - Abnormal; Notable for the following components:   Glucose-Capillary 314 (*)    All other components within normal limits  CBC WITH DIFFERENTIAL/PLATELET     EKG N/A.   RADIOLOGY  ED Provider Interpretation: N/A.  No results found.  PROCEDURES:  Critical Care performed: N/A.  Ultrasound ED Soft Tissue  Date/Time: 10/07/2021 11:33 AM  Performed by: Teodoro Spray, PA Authorized by: Teodoro Spray, PA   Procedure details:    Indications: localization of abscess and evaluate for cellulitis     Transverse view:  Visualized   Longitudinal view:  Visualized   Images: not archived     Limitations:  Positioning Location:    Location: buttocks  Side:  Right Findings:     no abscess present    cellulitis present    no foreign body present     MEDICATIONS ORDERED IN ED: Medications  lidocaine-EPINEPHrine (XYLOCAINE W/EPI) 2 %-1:200000 (PF) injection 10 mL (10 mLs Infiltration Given by Other 10/07/21 1104)  acetaminophen (TYLENOL) tablet 1,000 mg (1,000 mg Oral Given 10/07/21 1049)     IMPRESSION / MDM / ASSESSMENT AND PLAN / ED COURSE  I reviewed the triage vital signs and the nursing notes.                              Differential diagnosis includes, but is not limited to, pilonidal abscess, cellulitis, foreign body, contact dermatitis.  Assessment/Plan Presentation consistent with cellulitis versus early pilonidal abscess formation.  There does not appear to be  any drainable abscess at this time.  She has not had this happen to her before.  Lab work-up is reassuring.  No evidence of any systemic pathology.  We will provide her with a prescription for Bactrim.  We will additionally provide her with a short course of hydrocodone/acetaminophen to treat her pain.  Recommend that she follow-up with her primary care provider.  Will discharge.  Provided the patient with anticipatory guidance, return precautions, and educational material. Encouraged the patient to return to the emergency department at any time if they begin to experience any new or worsening symptoms. Patient expressed understanding and agreed with the plan.   Patient's presentation is most consistent with acute complicated illness / injury requiring diagnostic workup.       FINAL CLINICAL IMPRESSION(S) / ED DIAGNOSES   Final diagnoses:  Pilonidal abscess     Rx / DC Orders   ED Discharge Orders          Ordered    doxycycline (ADOXA) 100 MG tablet  2 times daily,   Status:  Discontinued        10/07/21 1135    sulfamethoxazole-trimethoprim (BACTRIM DS) 800-160 MG tablet  2 times daily        10/07/21 1137    HYDROcodone-acetaminophen (NORCO/VICODIN) 5-325 MG tablet  Every 6 hours PRN        10/07/21 1139             Note:  This document was prepared using Dragon voice recognition software and may include unintentional dictation errors.   Teodoro Spray, [place] 10/07/21 1140    Duffy Bruce, MD 10/07/21 1246

## 2021-10-07 NOTE — ED Notes (Signed)
Assisted provider for bedside u/s. Provider discussed findings with pt and home care, including sitz baths.

## 2022-06-08 ENCOUNTER — Encounter: Payer: Self-pay | Admitting: Hematology and Oncology

## 2023-01-28 ENCOUNTER — Ambulatory Visit
Admission: EM | Admit: 2023-01-28 | Discharge: 2023-01-28 | Disposition: A | Discharge status: Home / Self Care | Payer: 59 | Attending: Family Medicine | Admitting: Family Medicine

## 2023-01-28 ENCOUNTER — Encounter: Payer: Self-pay | Admitting: Emergency Medicine

## 2023-01-28 ENCOUNTER — Encounter: Payer: Self-pay | Admitting: Hematology and Oncology

## 2023-01-28 DIAGNOSIS — H66001 Acute suppurative otitis media without spontaneous rupture of ear drum, right ear: Secondary | ICD-10-CM | POA: Diagnosis not present

## 2023-01-28 DIAGNOSIS — J069 Acute upper respiratory infection, unspecified: Secondary | ICD-10-CM | POA: Insufficient documentation

## 2023-01-28 LAB — GROUP A STREP BY PCR: Group A Strep by PCR: NOT DETECTED

## 2023-01-28 MED ORDER — PROMETHAZINE-DM 6.25-15 MG/5ML PO SYRP: 5.0000 mL | ORAL_SOLUTION | Freq: Four times a day (QID) | ORAL | 0 refills | Status: AC | PRN

## 2023-01-28 MED ORDER — AMOXICILLIN-POT CLAVULANATE 875-125 MG PO TABS: 1.0000 | ORAL_TABLET | Freq: Two times a day (BID) | ORAL | 0 refills | Status: AC

## 2023-01-28 NOTE — ED Provider Notes (Signed)
 [facility]    CSN: [id number] Arrival date & time: 01/28/23  9157      History   Chief Complaint Chief Complaint  Patient presents with   Cough   Sore Throat    HPI Caitlyn Vincent is a 43 y.o. female.   HPI  A Spanish interpreter was used for this encounter:  Name: Caitlyn Vincent ID #[id number]  History obtained from the patient. [name] presents for body aches, fatigue, sore throat, ear pain, headaches, sinus congestion and fever.  Tmax 103 F.  She urinates when she blows her nose or coughs.  Tried Theraflu. Her husband is sick too but he is starting to feel better.    She is a type 1 diabetes and her blood sugars are using her insulins daily.  Home blood sugars have been running 300s.     Past Medical History:  Diagnosis Date   Anemia    Diabetes mellitus without complication (HCC)    Type II   Hepatitis    Hyperlipidemia    Vaginal bleeding     Patient Active Problem List   Diagnosis Date Noted   Iron  deficiency anemia 03/28/2018   Poorly controlled type 1 diabetes mellitus (HCC) 06/28/2016    Past Surgical History:  Procedure Laterality Date   CHOLECYSTECTOMY     LAPAROSCOPIC ASSISTED VAGINAL HYSTERECTOMY N/A 11/10/2020   Procedure: LAPAROSCOPIC ASSISTED VAGINAL HYSTERECTOMY;  Surgeon: Janit Alm Agent, MD;  Location: [facility];  Service: Gynecology;  Laterality: N/A;   REMOVAL OF DRUG DELIVERY IMPLANT N/A 11/10/2020   Procedure: REMOVAL OF DRUG DELIVERY IMPLANT (NEXPLANON  REMOVAL);  Surgeon: Janit Alm Agent, MD;  Location: [facility];  Service: Gynecology;  Laterality: N/A;    OB History     Gravida  2   Para  2   Term  2   Preterm  0   AB  0   Living  2      SAB  0   IAB  0   Ectopic  0   Multiple  0   Live Births  2            Home Medications    Prior to Admission medications   Medication Sig Start Date End Date Taking? Authorizing Provider  amoxicillin -clavulanate (AUGMENTIN ) 875-125 MG tablet Take 1  tablet by mouth every 12 (twelve) hours. 01/28/23  Yes Anquinette Pierro, DO  promethazine -dextromethorphan (PROMETHAZINE -DM) 6.25-15 MG/5ML syrup Take 5 mLs by mouth 4 (four) times daily as needed. 01/28/23  Yes Kong Packett, DO  Continuous Blood Gluc Transmit (DEXCOM G6 TRANSMITTER) MISC USE TO MONITOR BLOOD SUGAR. REPLACE EVERY 3 MONTHS 01/04/21   [provider]  gabapentin  (NEURONTIN ) 600 MG tablet Take 1 tablet (600 mg total) by mouth 2 (two) times daily. 04/08/21   Van Knee, MD  ibuprofen  (ADVIL ) 600 MG tablet Take 1 tablet (600 mg total) by mouth 3 (three) times daily. 04/08/21   Van Knee, MD  insulin  glargine (LANTUS) 100 UNIT/ML Solostar Pen Inject 40 Units into the skin at bedtime. 12/27/17   [provider]  insulin  regular (NOVOLIN R,HUMULIN R ) 100 units/mL injection Inject 30 Units into the skin 3 (three) times daily before meals.    [provider]  NOVOLIN R FLEXPEN RELION 100 UNIT/ML FlexPen Inject into the skin. 12/22/20   [provider]    Family History Family History  Problem Relation Age of Onset   Healthy Mother    Healthy Father     Social  History Social History   Tobacco Use   Smoking status: Never   Smokeless tobacco: Never  Vaping Use   Vaping status: Never Used  Substance Use Topics   Alcohol use: Not Currently   Drug use: No     Allergies   Patient has no known allergies.   Review of Systems Review of Systems: negative unless otherwise stated in HPI.      Physical Exam Triage Vital Signs ED Triage Vitals  Encounter Vitals Group     BP 01/28/23 0921 128/71     Systolic BP Percentile --      Diastolic BP Percentile --      Pulse Rate 01/28/23 0921 91     Resp 01/28/23 0921 15     Temp 01/28/23 0921 98.4 F (36.9 C)     Temp Source 01/28/23 0921 Oral     SpO2 01/28/23 0921 95 %     Weight 01/28/23 0920 149 lb 0.5 oz (67.6 kg)     Height 01/28/23 0920 5' 2 (1.575 m)     Head  Circumference --      Peak Flow --      Pain Score 01/28/23 0920 8     Pain Loc --      Pain Education --      Exclude from Growth Chart --    No data found.  Updated Vital Signs BP 128/71 (BP Location: Right Arm)   Pulse 91   Temp 98.4 F (36.9 C) (Oral)   Resp 15   Ht 5' 2 (1.575 m)   Wt 67.6 kg   LMP  (LMP Unknown)   SpO2 95%   BMI 27.26 kg/m   Visual Acuity Right Eye Distance:   Left Eye Distance:   Bilateral Distance:    Right Eye Near:   Left Eye Near:    Bilateral Near:     Physical Exam GEN:     alert, ill but non-toxic appearing female in no distress    HENT:  mucus membranes moist, oropharyngeal without lesions or erythema, no tonsillar hypertrophy or exudates, clear nasal discharge, right TM bulging, erythematous and opaque on the left TM normal EYES:   no scleral injection or discharge RESP:  no increased work of breathing, clear to auscultation bilaterally, frequent cough CVS:   regular rate and rhythm Skin:   warm and dry, no rash on visible skin    UC Treatments / Results  Labs (all labs ordered are listed, but only abnormal results are displayed) Labs Reviewed  GROUP A STREP BY PCR    EKG   Radiology No results found.  Procedures Procedures (including critical care time)  Medications Ordered in UC Medications - No data to display  Initial Impression / Assessment and Plan / UC Course  I have reviewed the triage vital signs and the nursing notes.  Pertinent labs & imaging results that were available during my care of the patient were reviewed by me and considered in my medical decision making (see chart for details).       Pt is a 43 y.o. female who presents for 6 days of respiratory symptoms. [name] is afebrile here without recent antipyretics. Satting well on room air. Overall pt is non-toxic appearing, well hydrated, without respiratory distress. Pulmonary exam is unremarkable.  COVID and influenza panel deferred.  Chest imaging  deferred.   History consistent with viral respiratory illness. Discussed symptomatic treatment.  Promethazine  DM for cough.  She does have evidence of  right otitis media.  With Augmentin  as below.  Typical duration of symptoms discussed.  Work note provided.  Return and ED precautions given and voiced understanding. Discussed MDM, treatment plan and plan for follow-up with patient who agrees with plan.     Final Clinical Impressions(s) / UC Diagnoses   Final diagnoses:  Non-recurrent acute suppurative otitis media of right ear without spontaneous rupture of tympanic membrane  URI with cough and congestion     Discharge Instructions      Tu prueba de estreptococos es negativa. Tiene evidencia de una infeccin de odo.  Hacer grgaras con agua salada puede ayudar con el dolor de garganta.  El medicamento para la tos recetado para la tos.  Tenga en cuenta que este medicamento puede provocarle un poco de sueo.    Pase por la farmacia para recoger sus recetas.  Haga un seguimiento con su proveedor de atencin primaria o regrese a la atencin de urgencia segn sea necesario. Tus notas de trabajo dicen que regreses el lunes.  Your strep test is negative. You have evidence of an ear infection.  Gargling with salt water can help with your sore throat.  The prescription cough medicine for your cough.  Note that this medication can make you a little sleepy.    Stop by the pharmacy to pick up your prescriptions.  Follow up with your primary care provider or return to the urgent care as needed.      ED Prescriptions     Medication Sig Dispense Auth. Provider   amoxicillin -clavulanate (AUGMENTIN ) 875-125 MG tablet Take 1 tablet by mouth every 12 (twelve) hours. 14 tablet Takoya Jonas, DO   promethazine -dextromethorphan (PROMETHAZINE -DM) 6.25-15 MG/5ML syrup Take 5 mLs by mouth 4 (four) times daily as needed. 118 mL Ibrahima Holberg, DO      PDMP not reviewed this encounter.   Kriste Berth, DO 01/28/23 1019

## 2023-01-28 NOTE — Discharge Instructions (Addendum)
 Tu prueba de estreptococos es negativa. Tiene evidencia de una infeccin de odo.  Hacer grgaras con agua salada puede ayudar con el dolor de garganta.  El medicamento para la tos recetado para la tos.  Tenga en cuenta que este medicamento puede provocarle un poco de sueo.    Pase por la farmacia para recoger sus recetas.  Haga un seguimiento con su proveedor de atencin primaria o regrese a la atencin de urgencia segn sea necesario. Tus notas de trabajo dicen que regreses el lunes.  Your strep test is negative. You have evidence of an ear infection.  Gargling with salt water can help with your sore throat.  The prescription cough medicine for your cough.  Note that this medication can make you a little sleepy.    Stop by the pharmacy to pick up your prescriptions.  Follow up with your primary care provider or return to the urgent care as needed.

## 2023-01-28 NOTE — ED Triage Notes (Signed)
 Patient c/o sore throat, cough, ear pain, headache, and bodyaches that started last Saturday.  Patient reports fevers.

## 2023-03-02 ENCOUNTER — Encounter: Payer: Self-pay | Admitting: Hematology and Oncology

## 2023-07-06 ENCOUNTER — Other Ambulatory Visit: Payer: Self-pay

## 2023-07-06 ENCOUNTER — Emergency Department

## 2023-07-06 ENCOUNTER — Emergency Department
Admission: EM | Admit: 2023-07-06 | Discharge: 2023-07-07 | Disposition: A | Discharge status: Home / Self Care | Attending: Emergency Medicine | Admitting: Emergency Medicine

## 2023-07-06 ENCOUNTER — Encounter: Payer: Self-pay | Admitting: Hematology and Oncology

## 2023-07-06 DIAGNOSIS — R1084 Generalized abdominal pain: Secondary | ICD-10-CM | POA: Diagnosis present

## 2023-07-06 DIAGNOSIS — E109 Type 1 diabetes mellitus without complications: Secondary | ICD-10-CM | POA: Insufficient documentation

## 2023-07-06 DIAGNOSIS — R112 Nausea with vomiting, unspecified: Secondary | ICD-10-CM | POA: Diagnosis not present

## 2023-07-06 DIAGNOSIS — R197 Diarrhea, unspecified: Secondary | ICD-10-CM | POA: Diagnosis not present

## 2023-07-06 LAB — COMPREHENSIVE METABOLIC PANEL WITH GFR
ALT: 41 U/L (ref 0–44)
AST: 38 U/L (ref 15–41)
Albumin: 3.7 g/dL (ref 3.5–5.0)
Alkaline Phosphatase: 66 U/L (ref 38–126)
Anion gap: 8 (ref 5–15)
BUN: 11 mg/dL (ref 6–20)
CO2: 22 mmol/L (ref 22–32)
Calcium: 8.8 mg/dL — ABNORMAL LOW (ref 8.9–10.3)
Chloride: 106 mmol/L (ref 98–111)
Creatinine, Ser: 0.67 mg/dL (ref 0.44–1.00)
GFR, Estimated: >60 mL/min (ref >60)
Glucose, Bld: 152 mg/dL — ABNORMAL HIGH (ref 70–99)
Potassium: 3.6 mmol/L (ref 3.5–5.1)
Sodium: 136 mmol/L (ref 135–145)
Total Bilirubin: 0.6 mg/dL (ref 0.0–1.2)
Total Protein: 6.8 g/dL (ref 6.5–8.1)

## 2023-07-06 LAB — CBC
HCT: 40 % (ref 36.0–46.0)
Hemoglobin: 13.7 g/dL (ref 12.0–15.0)
MCH: 29 pg (ref 26.0–34.0)
MCHC: 34.3 g/dL (ref 30.0–36.0)
MCV: 84.7 fL (ref 80.0–100.0)
Platelets: 365 10*3/uL (ref 150–400)
RBC: 4.72 MIL/uL (ref 3.87–5.11)
RDW: 12.9 % (ref 11.5–15.5)
WBC: 5.4 10*3/uL (ref 4.0–10.5)
nRBC: 0 % (ref 0.0–0.2)

## 2023-07-06 LAB — URINALYSIS, ROUTINE W REFLEX MICROSCOPIC
Bilirubin Urine: NEGATIVE
Glucose, UA: NEGATIVE mg/dL
Hgb urine dipstick: NEGATIVE
Ketones, ur: 5 mg/dL — AB
Leukocytes,Ua: NEGATIVE
Nitrite: NEGATIVE
Protein, ur: NEGATIVE mg/dL
Specific Gravity, Urine: 1.01 (ref 1.005–1.030)
pH: 8 (ref 5.0–8.0)

## 2023-07-06 LAB — CBG MONITORING, ED: Glucose-Capillary: 122 mg/dL — ABNORMAL HIGH (ref 70–99)

## 2023-07-06 LAB — LIPASE, BLOOD: Lipase: 24 U/L (ref 11–51)

## 2023-07-06 MED ORDER — MORPHINE SULFATE (PF) 4 MG/ML IV SOLN
4.0000 mg | Freq: Once | INTRAVENOUS | Status: AC
  Administered 2023-07-06: 4 mg via INTRAVENOUS
  Filled 2023-07-06: qty 1

## 2023-07-06 MED ORDER — METOCLOPRAMIDE HCL 5 MG/ML IJ SOLN
10.0000 mg | Freq: Once | INTRAMUSCULAR | Status: AC
  Administered 2023-07-06: 10 mg via INTRAVENOUS
  Filled 2023-07-06: qty 2

## 2023-07-06 MED ORDER — IOHEXOL 300 MG/ML  SOLN
100.0000 mL | Freq: Once | INTRAMUSCULAR | Status: AC | PRN
  Administered 2023-07-06: 100 mL via INTRAVENOUS

## 2023-07-06 MED ORDER — METOCLOPRAMIDE HCL 10 MG PO TABS: 10.0000 mg | ORAL_TABLET | Freq: Three times a day (TID) | ORAL | 1 refills | Status: AC | PRN

## 2023-07-06 MED ORDER — SODIUM CHLORIDE 0.9 % IV BOLUS
1000.0000 mL | Freq: Once | INTRAVENOUS | Status: AC
  Administered 2023-07-07: 1000 mL via INTRAVENOUS

## 2023-07-06 MED ORDER — ALUM & MAG HYDROXIDE-SIMETH 200-200-20 MG/5ML PO SUSP
30.0000 mL | Freq: Once | ORAL | Status: AC
  Administered 2023-07-06: 30 mL via ORAL
  Filled 2023-07-06: qty 30

## 2023-07-06 MED ORDER — LIDOCAINE VISCOUS HCL 2 % MT SOLN
15.0000 mL | Freq: Once | OROMUCOSAL | Status: AC
  Administered 2023-07-06: 15 mL via ORAL
  Filled 2023-07-06: qty 15

## 2023-07-06 NOTE — ED Provider Notes (Signed)
 [facility] Provider Note    Event Date/Time   First MD Initiated Contact with Patient 07/06/23 2305     (approximate)   History   Abdominal Pain   HPI  Caitlyn Vincent is a 43 y.o. female   Past medical history of poorly controlled type I diabetic, anemia, status postcholecystectomy and hysterectomy who presents emergency department with abdominal discomfort, nausea vomiting and diarrhea.  Describes the pain onset yesterday with crampy abdominal pain, diffuse, non-radiating, associated with nausea vomiting diarrhea.  No GI bleeding.  No urinary symptoms.  Her surgeries were remote, years ago.  She saw endocrinology yesterday who increased her insulin  due to poorly controlled blood sugars, no other medication changes recently.  She denies respiratory symptoms, or chest pain.      External Medical Documents Reviewed: Endocrinology visit from yesterday, notes reviewed, documenting poorly controlled diabetes with elevated A1c on recent testing and recent increase in insulin  regimen.      Physical Exam   Triage Vital Signs: ED Triage Vitals  Encounter Vitals Group     BP 07/06/23 2205 (!) 133/98     Girls Systolic BP Percentile --      Girls Diastolic BP Percentile --      Boys Systolic BP Percentile --      Boys Diastolic BP Percentile --      Pulse Rate 07/06/23 2205 82     Resp 07/06/23 2205 16     Temp 07/06/23 2205 98.2 F (36.8 C)     Temp Source 07/06/23 2205 Oral     SpO2 07/06/23 2205 100 %     Weight 07/06/23 2206 150 lb (68 kg)     Height 07/06/23 2206 5' 2 (1.575 m)     Head Circumference --      Peak Flow --      Pain Score 07/06/23 2206 10     Pain Loc --      Pain Education --      Exclude from Growth Chart --     Most recent vital signs: Vitals:   07/06/23 2205  BP: (!) 133/98  Pulse: 82  Resp: 16  Temp: 98.2 F (36.8 C)  SpO2: 100%    General: Awake, no distress.  CV:  Good peripheral perfusion.   Resp:  Normal effort.  Abd:  No distention.  Other:  Diffuse mild tenderness without rigidity or guarding.  Auscultation of the chest wall elicits no pain.  She otherwise awake and pleasant answering questions appropriately.   ED Results / Procedures / Treatments   Labs (all labs ordered are listed, but only abnormal results are displayed) Labs Reviewed  COMPREHENSIVE METABOLIC PANEL WITH GFR - Abnormal; Notable for the following components:      Result Value   Glucose, Bld 152 (*)    Calcium 8.8 (*)    All other components within normal limits  URINALYSIS, ROUTINE W REFLEX MICROSCOPIC - Abnormal; Notable for the following components:   Color, Urine YELLOW (*)    APPearance CLEAR (*)    Ketones, ur 5 (*)    All other components within normal limits  CBG MONITORING, ED - Abnormal; Notable for the following components:   Glucose-Capillary 122 (*)    All other components within normal limits  LIPASE, BLOOD  CBC  TROPONIN I (HIGH SENSITIVITY)     I ordered and reviewed the above labs they are notable for LFTs/bilirubin within normal limit.  EKG  ED ECG  REPORT I, Buell Carmin, the attending physician, personally viewed and interpreted this ECG.   Date: 07/06/2023  EKG Time: 0002  Rate: 74  Rhythm: sinus  Axis: nl  Intervals:nl  ST&T Change: no stemi    RADIOLOGY I independently reviewed and interpreted CT scan of the abdomen and see no obvious obstructive or inflammatory changes. I also reviewed radiologist's formal read.   PROCEDURES:  Critical Care performed: No  Procedures   MEDICATIONS ORDERED IN ED: Medications  iohexol  (OMNIPAQUE ) 300 MG/ML solution 100 mL (100 mLs Intravenous Contrast Given 07/06/23 2312)  metoCLOPramide  (REGLAN ) injection 10 mg (10 mg Intravenous Given 07/06/23 2358)  sodium chloride  0.9 % bolus 1,000 mL (1,000 mLs Intravenous New Bag/Given 07/07/23 0002)  morphine  (PF) 4 MG/ML injection 4 mg (4 mg Intravenous Given 07/06/23 2358)  alum &  mag hydroxide-simeth (MAALOX/MYLANTA) 200-200-20 MG/5ML suspension 30 mL (30 mLs Oral Given 07/06/23 2358)    And  lidocaine  (XYLOCAINE ) 2 % viscous mouth solution 15 mL (15 mLs Oral Given 07/06/23 2358)     IMPRESSION / MDM / ASSESSMENT AND PLAN / ED COURSE  I reviewed the triage vital signs and the nursing notes.                                Patient's presentation is most consistent with acute presentation with potential threat to life or bodily function.  Differential diagnosis includes, but is not limited to, gastroenteritis, colitis, diverticulitis, appendicitis, choledocholithiasis, gastroparesis, obstruction, urinary tract infection, considered less likely ACS, ischemic gut, kidney stone   The patient is on the cardiac monitor to evaluate for evidence of arrhythmia and/or significant heart rate changes.  MDM:    Crampy abdominal pain associate with nausea vomiting diarrhea most likely viral illness.  Relatively benign abdominal exam though does have diffuse mild tenderness, with CT scan showing no surgical pathologies or acute abnormalities fortunately.  Given therapeutic treatment with fluids, IV morphine  and Reglan .  Doubt ACS or other cardiopulmonary emergencies given no respiratory symptoms or chest pain.  Doubt ischemic gut given crampy intermittent nature of pain associate with nausea vomiting diarrhea no pain out of proportion.  Will give Reglan  in case there is an element of gastroparesis in the setting of her poorly controlled blood sugars.  Given her stability Emergency Department and negative evaluation for emergency life-threatening conditions as above, plan will be for discharge close PMD follow-up.       FINAL CLINICAL IMPRESSION(S) / ED DIAGNOSES   Final diagnoses:  Generalized abdominal pain  Nausea vomiting and diarrhea     Rx / DC Orders   ED Discharge Orders          Ordered    metoCLOPramide  (REGLAN ) 10 MG tablet  Every 8 hours PRN         07/06/23 2339             Note:  This document was prepared using Dragon voice recognition software and may include unintentional dictation errors.    Buell Carmin, MD 07/07/23 [id number]

## 2023-07-06 NOTE — Discharge Instructions (Addendum)
 Fortunately your evaluation in the emergency department did not show any emergency conditions for example problems that would require hospitalization or surgery, that account for your pain. Thank you for choosing us  for your health care today!  Please see your primary doctor this week for a follow up appointment.   If you have any new, worsening, or unexpected symptoms call your doctor right away or come back to the emergency department for reevaluation.  It was my pleasure to care for you today.   Arron Large Margery Sheets, MD   --   Afortunadamente su evaluacin en el servicio de urgencias no mostr ninguna condicin de Associate Professor, por ejemplo problemas que requeriran hospitalizacin o ciruga, que sean la causa de su dolor.  Gracias por elegirnos para el cuidado de su salud hoy!  Consulte a su mdico de atencin primaria esta semana para una cita de seguimiento.   Si tiene algn sntoma nuevo, que empeora o inesperado, llame a su mdico de inmediato o regrese al departamento de emergencias para una nueva evaluacin.  Fue un placer cuidar de usted hoy.   Arron Large Margery Sheets, MD

## 2023-07-06 NOTE — ED Triage Notes (Signed)
 Pt presents via POV c/o abd pain since yesterday. Reports seen for the same yesterday but pain is worse. Report N/V/D associated with the abd pain.   Hx DMI per pt report.   Reports cholecystectomy and hysterectomy

## 2023-07-07 LAB — TROPONIN I (HIGH SENSITIVITY): Troponin I (High Sensitivity): 3 ng/L (ref <18)

## 2023-09-26 ENCOUNTER — Other Ambulatory Visit: Payer: Self-pay | Admitting: Nurse Practitioner

## 2023-09-26 DIAGNOSIS — Z1231 Encounter for screening mammogram for malignant neoplasm of breast: Secondary | ICD-10-CM

## 2023-10-07 IMAGING — DX DG FOOT COMPLETE 3+V*L*
3 series · 3 of 3 positions shown · non-contrast
Comparison: None.

CLINICAL DATA: Foot pain.

EXAM:
LEFT FOOT - COMPLETE 3+ VIEW

[foot ap]
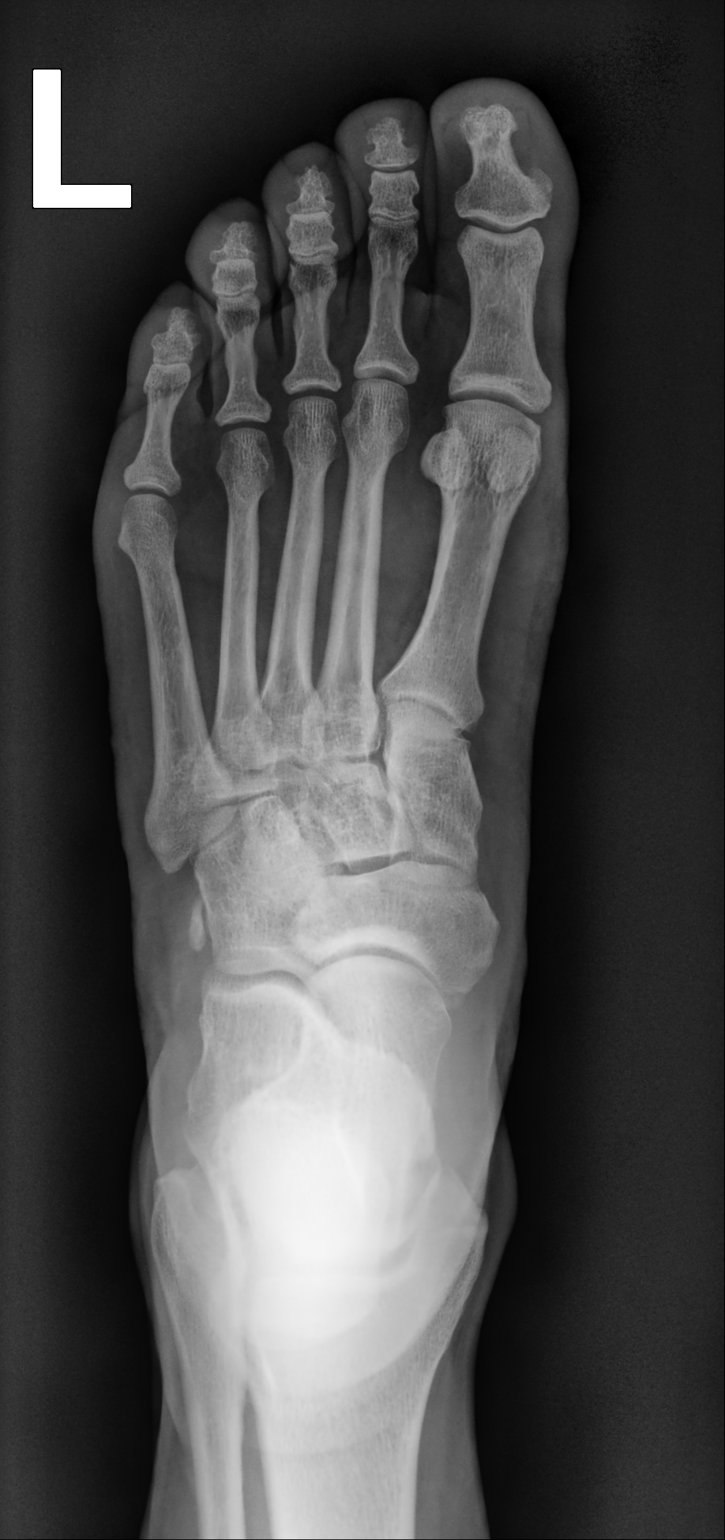

[foot mlo]
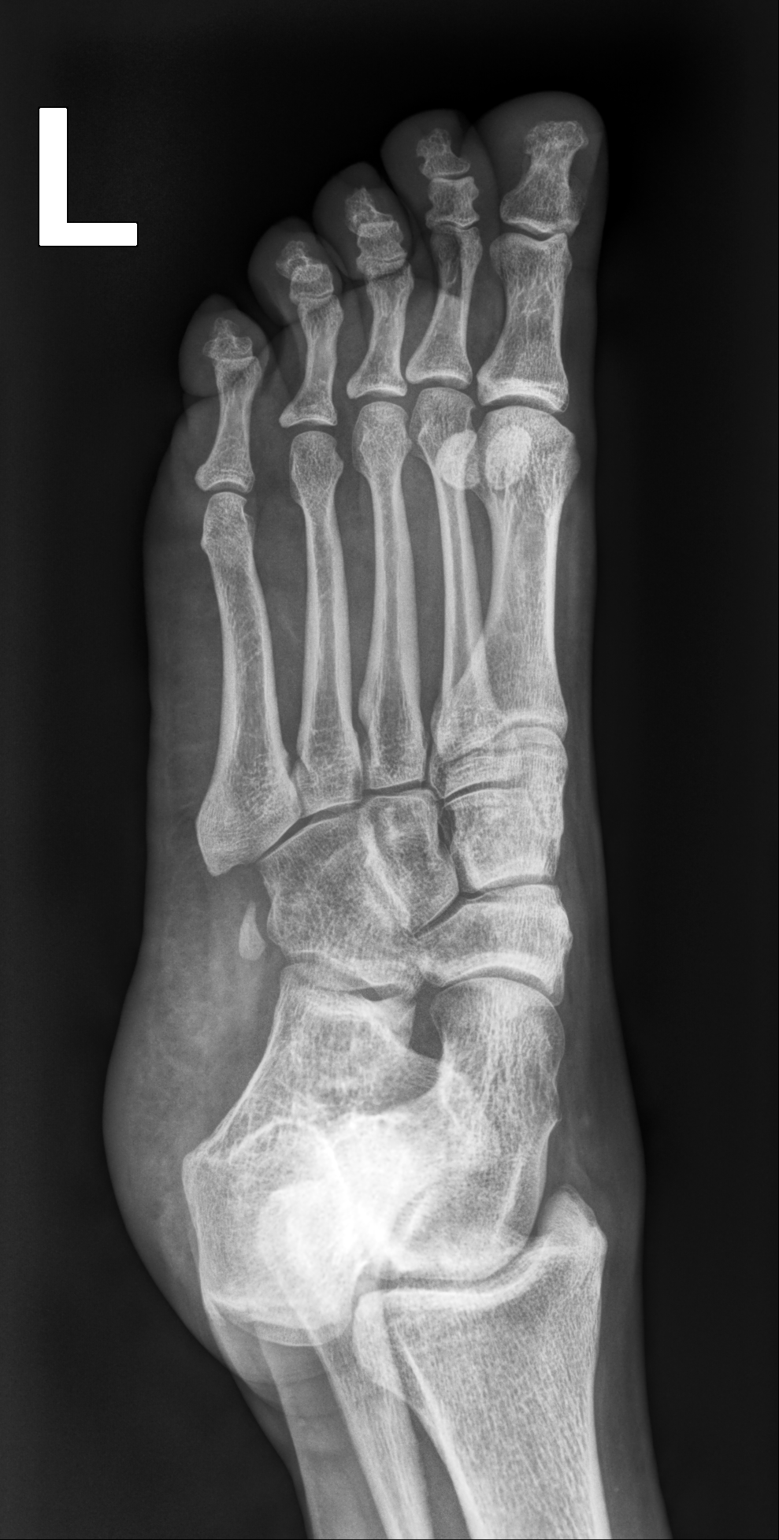

[foot lat]
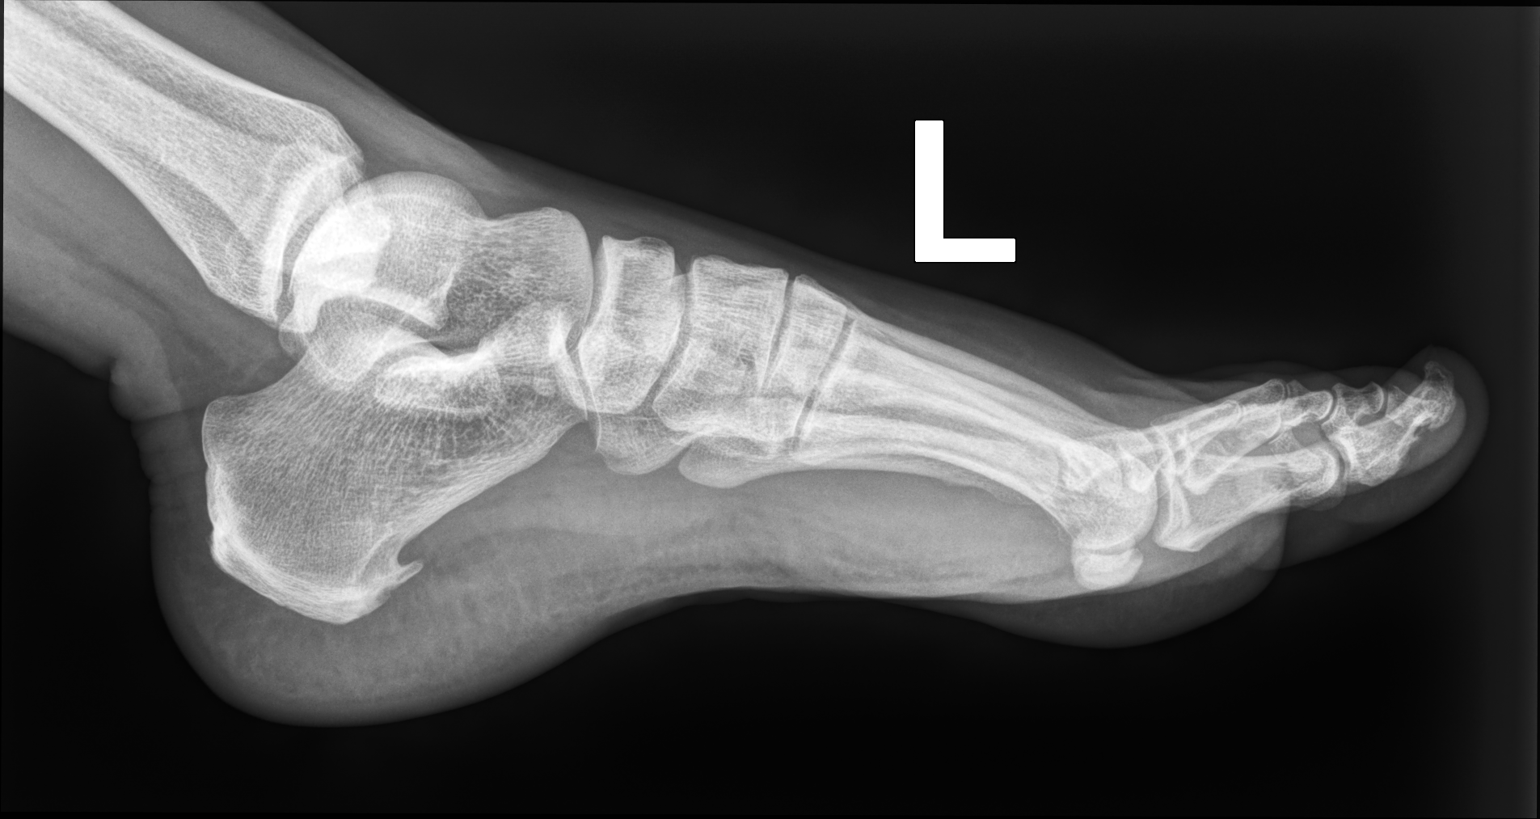

[3 of 3 positions shown; findings below may reference images not displayed]

FINDINGS: There is no evidence of fracture or dislocation. Joint spaces appear
well maintained. There is a plantar calcaneal spur. Soft tissues are
within normal limits. Soft tissues are unremarkable.
IMPRESSION: Negative.
